# Patient Record
Sex: Female | Born: 1990 | Race: White | Hispanic: No | Marital: Single | Smoking: Former smoker
Health system: Southern US, Community
[De-identification: ages and names within clinical notes are randomized; demographics above are authoritative.]

## PROBLEM LIST (undated history)

## (undated) DIAGNOSIS — J45909 Unspecified asthma, uncomplicated: Secondary | ICD-10-CM

## (undated) DIAGNOSIS — D649 Anemia, unspecified: Secondary | ICD-10-CM

## (undated) DIAGNOSIS — J40 Bronchitis, not specified as acute or chronic: Secondary | ICD-10-CM

## (undated) DIAGNOSIS — F329 Major depressive disorder, single episode, unspecified: Secondary | ICD-10-CM

## (undated) DIAGNOSIS — F419 Anxiety disorder, unspecified: Secondary | ICD-10-CM

## (undated) DIAGNOSIS — F32A Depression, unspecified: Secondary | ICD-10-CM

## (undated) HISTORY — DX: Bronchitis, not specified as acute or chronic: J40

## (undated) HISTORY — PX: NO PAST SURGERIES: SHX2092

## (undated) HISTORY — DX: Major depressive disorder, single episode, unspecified: F32.9

## (undated) HISTORY — DX: Anxiety disorder, unspecified: F41.9

## (undated) HISTORY — PX: OTHER SURGICAL HISTORY: SHX169

## (undated) HISTORY — DX: Unspecified asthma, uncomplicated: J45.909

## (undated) HISTORY — DX: Depression, unspecified: F32.A

## (undated) SURGERY — Surgical Case
Anesthesia: Epidural

---

## 2015-02-26 ENCOUNTER — Observation Stay
Admission: EM | Admit: 2015-02-26 | Discharge: 2015-02-26 | Disposition: A | Discharge status: Home / Self Care | Payer: Medicaid Other | Attending: Obstetrics and Gynecology | Admitting: Obstetrics and Gynecology

## 2015-02-26 DIAGNOSIS — O99213 Obesity complicating pregnancy, third trimester: Principal | ICD-10-CM | POA: Insufficient documentation

## 2015-02-26 DIAGNOSIS — Z3A39 39 weeks gestation of pregnancy: Secondary | ICD-10-CM | POA: Diagnosis not present

## 2015-02-26 DIAGNOSIS — O468X3 Other antepartum hemorrhage, third trimester: Secondary | ICD-10-CM | POA: Insufficient documentation

## 2015-02-26 DIAGNOSIS — Z6841 Body Mass Index (BMI) 40.0 and over, adult: Secondary | ICD-10-CM | POA: Diagnosis not present

## 2015-02-26 DIAGNOSIS — N930 Postcoital and contact bleeding: Secondary | ICD-10-CM | POA: Diagnosis not present

## 2015-02-26 HISTORY — DX: Anemia, unspecified: D64.9

## 2015-02-26 NOTE — OB Triage Note (Signed)
Patient presents with c/o "vaginal bleeding" after intercourse tonight.  Active fetus reported, patient states "light pink discharge with wiping"  No active bleeding reported or noted on admission.  efm and toco applied. fhr-147 Abdomen soft.  Reports some "cramping" but no reglar contractions or pain.

## 2015-02-26 NOTE — Procedures (Cosign Needed)
24 yo G1 at [redacted]w[redacted]d who noted spotting today after intercourse, evaluated by nursing no active bleeding, soft abdomen, no contractions with reactive NST.  Routine labor precautions, follow up 10/10 with NST/AFI secondary to BMI >40.

## 2015-03-06 ENCOUNTER — Inpatient Hospital Stay
Admission: RE | Admit: 2015-03-06 | Discharge: 2015-03-10 | DRG: 765 | Disposition: A | Discharge status: Home / Self Care | Payer: Medicaid Other | Source: Ambulatory Visit | Attending: Obstetrics and Gynecology | Admitting: Obstetrics and Gynecology

## 2015-03-06 DIAGNOSIS — O339 Maternal care for disproportion, unspecified: Secondary | ICD-10-CM | POA: Diagnosis not present

## 2015-03-06 DIAGNOSIS — O99214 Obesity complicating childbirth: Secondary | ICD-10-CM | POA: Diagnosis present

## 2015-03-06 DIAGNOSIS — E669 Obesity, unspecified: Secondary | ICD-10-CM | POA: Diagnosis present

## 2015-03-06 DIAGNOSIS — O480 Post-term pregnancy: Principal | ICD-10-CM | POA: Diagnosis present

## 2015-03-06 DIAGNOSIS — Z3490 Encounter for supervision of normal pregnancy, unspecified, unspecified trimester: Secondary | ICD-10-CM

## 2015-03-06 DIAGNOSIS — Z3A40 40 weeks gestation of pregnancy: Secondary | ICD-10-CM | POA: Diagnosis not present

## 2015-03-06 DIAGNOSIS — O9081 Anemia of the puerperium: Secondary | ICD-10-CM | POA: Diagnosis not present

## 2015-03-06 DIAGNOSIS — Z6841 Body Mass Index (BMI) 40.0 and over, adult: Secondary | ICD-10-CM | POA: Diagnosis not present

## 2015-03-06 DIAGNOSIS — D62 Acute posthemorrhagic anemia: Secondary | ICD-10-CM | POA: Diagnosis not present

## 2015-03-06 LAB — TYPE AND SCREEN
ABO/RH(D): O NEG
Antibody Screen: NEGATIVE

## 2015-03-06 LAB — CBC
HCT: 36.9 % (ref 35.0–47.0)
HEMOGLOBIN: 12.8 g/dL (ref 12.0–16.0)
MCH: 31.2 pg (ref 26.0–34.0)
MCHC: 34.7 g/dL (ref 32.0–36.0)
MCV: 89.9 fL (ref 80.0–100.0)
PLATELETS: 259 10*3/uL (ref 150–440)
RBC: 4.11 MIL/uL (ref 3.80–5.20)
RDW: 13.3 % (ref 11.5–14.5)
WBC: 16.7 10*3/uL — AB (ref 3.6–11.0)

## 2015-03-06 MED ORDER — MISOPROSTOL 200 MCG PO TABS
800.0000 ug | ORAL_TABLET | ORAL | Status: DC
Start: 2015-03-06 — End: 2015-03-07
  Filled 2015-03-06: qty 4

## 2015-03-06 MED ORDER — LACTATED RINGERS IV SOLN
INTRAVENOUS | Status: DC
  Administered 2015-03-06: 125 mL/h via INTRAVENOUS
  Administered 2015-03-07: 13:00:00 via INTRAVENOUS
  Administered 2015-03-07: 125 mL/h via INTRAVENOUS

## 2015-03-06 MED ORDER — CITRIC ACID-SODIUM CITRATE 334-500 MG/5ML PO SOLN: 30.0000 mL | ORAL | Status: DC | PRN

## 2015-03-06 MED ORDER — OXYTOCIN 40 UNITS IN LACTATED RINGERS INFUSION - SIMPLE MED
1.0000 m[IU]/min | INTRAVENOUS | Status: DC
  Administered 2015-03-06: 1 m[IU]/min via INTRAVENOUS

## 2015-03-06 MED ORDER — LIDOCAINE HCL (PF) 1 % IJ SOLN
30.0000 mL | INTRAMUSCULAR | Status: DC | PRN
  Filled 2015-03-06: qty 30

## 2015-03-06 MED ORDER — TERBUTALINE SULFATE 1 MG/ML IJ SOLN: 0.2500 mg | Freq: Once | INTRAMUSCULAR | Status: DC | PRN

## 2015-03-06 MED ORDER — ONDANSETRON HCL 4 MG/2ML IJ SOLN: 4.0000 mg | Freq: Four times a day (QID) | INTRAMUSCULAR | Status: DC | PRN

## 2015-03-06 MED ORDER — LACTATED RINGERS IV SOLN: 500.0000 mL | INTRAVENOUS | Status: DC | PRN

## 2015-03-06 MED ORDER — ACETAMINOPHEN 325 MG PO TABS: 650.0000 mg | ORAL_TABLET | ORAL | Status: DC | PRN

## 2015-03-06 MED ORDER — OXYTOCIN BOLUS FROM INFUSION: 500.0000 mL | INTRAVENOUS | Status: DC

## 2015-03-06 MED ORDER — OXYTOCIN 40 UNITS IN LACTATED RINGERS INFUSION - SIMPLE MED
62.5000 mL/h | INTRAVENOUS | Status: DC
  Administered 2015-03-07: 1000 mL via INTRAVENOUS
  Filled 2015-03-06 (×2): qty 1000

## 2015-03-06 MED ORDER — AMMONIA AROMATIC IN INHA
0.3000 mL | Freq: Once | RESPIRATORY_TRACT | Status: DC | PRN
Start: 2015-03-06 — End: 2015-03-07
  Filled 2015-03-06: qty 10

## 2015-03-06 MED ORDER — INFLUENZA VAC SPLIT QUAD 0.5 ML IM SUSY
0.5000 mL | PREFILLED_SYRINGE | INTRAMUSCULAR | Status: DC
  Filled 2015-03-06: qty 0.5

## 2015-03-06 NOTE — H&P (Signed)
Date of Initial H&P: 02/27/15  History reviewed, patient examined, no change in status, except as noted below   24 year old G1 P0 with EDC=02/27/15  (LMP=19wk ultrasound) presents at 40.6 weeks for IOL due to postdates. Pregnancy c/b obesity (BMI43) and RH negative. Received Rhogam at 28 weeks (7/15). Has gained 62# with this pregnancy. EFW 01/30/15 was 3000gm (89th%). Current EFW by Le[place]= 4000-4200 gms.  Labs: o neg// RI//VI//TDAP UTD//GBS negative.  Meds: PNV and iron  NKDA  ROS: positive for irregular contractions. No VB or LOF. Baby active  EXAM: BP 127/74 mmHg  Pulse 96  Temp(Src) 98.5 F (36.9 C) (Oral)  Resp 16  Ht 5\' 2"  (1.575 m)  Wt 248 lb (112.492 kg)  BMI 45.35 kg/m2  LMP 05/23/2014 (LMP Unknown)  General: in NAD FHR 150 with accels to 170, moderate variability, no decelerations Toco: contractions q3-11 min apart US: cephalic/LOP Cervix: 4/80%/-1 to -2  A: IUP at 40.6 weeks for IOL due to postdates with a Bishop score of 9 Suspect macrosomic infant Cat 1tracing   P: Pitocin induction. Reviewed risks of induction including hyperstimulation, FITL, FTP, failed induction and Cesarean section. Patient agrees with POM GBS neg- no PPX needed At risk for shoulder dystocia  Corrina Steffensen, CNM

## 2015-03-06 NOTE — Progress Notes (Signed)
Bedside US done by C.Sharen HonesGutierrez, CNM.

## 2015-03-07 ENCOUNTER — Inpatient Hospital Stay: Payer: Medicaid Other | Admitting: Certified Registered"

## 2015-03-07 ENCOUNTER — Encounter
Admission: RE | Disposition: A | Discharge status: Home / Self Care | Payer: Self-pay | Source: Ambulatory Visit | Attending: Obstetrics and Gynecology

## 2015-03-07 ENCOUNTER — Encounter: Payer: Self-pay | Admitting: *Deleted

## 2015-03-07 LAB — ABO/RH: ABO/RH(D): O NEG

## 2015-03-07 SURGERY — Surgical Case
Anesthesia: Epidural

## 2015-03-07 MED ORDER — ONDANSETRON HCL 4 MG/2ML IJ SOLN
INTRAMUSCULAR | Status: DC | PRN
  Administered 2015-03-07: 4 mg via INTRAVENOUS

## 2015-03-07 MED ORDER — BUPIVACAINE HCL (PF) 0.5 % IJ SOLN
INTRAMUSCULAR | Status: AC
  Filled 2015-03-07: qty 30

## 2015-03-07 MED ORDER — LIDOCAINE HCL (PF) 2 % IJ SOLN
INTRAMUSCULAR | Status: DC | PRN
  Administered 2015-03-07 (×2): 100 mg via INTRADERMAL

## 2015-03-07 MED ORDER — PRENATAL MULTIVITAMIN CH
1.0000 | ORAL_TABLET | Freq: Every day | ORAL | Status: DC
  Administered 2015-03-08 – 2015-03-10 (×3): 1 via ORAL
  Filled 2015-03-07 (×3): qty 1

## 2015-03-07 MED ORDER — DIBUCAINE 1 % RE OINT: 1.0000 "application " | TOPICAL_OINTMENT | RECTAL | Status: DC | PRN

## 2015-03-07 MED ORDER — OXYTOCIN 40 UNITS IN LACTATED RINGERS INFUSION - SIMPLE MED
62.5000 mL/h | INTRAVENOUS | Status: AC
  Administered 2015-03-08: 62.5 mL/h via INTRAVENOUS
  Filled 2015-03-07: qty 1000

## 2015-03-07 MED ORDER — CITRIC ACID-SODIUM CITRATE 334-500 MG/5ML PO SOLN
30.0000 mL | ORAL | Status: AC
  Administered 2015-03-07: 30 mL via ORAL
  Filled 2015-03-07: qty 15

## 2015-03-07 MED ORDER — SENNOSIDES-DOCUSATE SODIUM 8.6-50 MG PO TABS
2.0000 | ORAL_TABLET | ORAL | Status: DC
  Administered 2015-03-07 – 2015-03-09 (×3): 2 via ORAL
  Filled 2015-03-07 (×3): qty 2

## 2015-03-07 MED ORDER — LANOLIN HYDROUS EX OINT
1.0000 | TOPICAL_OINTMENT | CUTANEOUS | Status: DC | PRN
Start: 2015-03-07 — End: 2015-03-10

## 2015-03-07 MED ORDER — BUPIVACAINE HCL (PF) 0.25 % IJ SOLN
INTRAMUSCULAR | Status: DC | PRN
  Administered 2015-03-07: 5 mL via EPIDURAL

## 2015-03-07 MED ORDER — BUPIVACAINE 0.25 % ON-Q PUMP DUAL CATH 400 ML: 400.0000 mL | INJECTION | Status: DC

## 2015-03-07 MED ORDER — SIMETHICONE 80 MG PO CHEW
80.0000 mg | CHEWABLE_TABLET | ORAL | Status: DC
  Administered 2015-03-07: 80 mg via ORAL
  Filled 2015-03-07: qty 1

## 2015-03-07 MED ORDER — OXYCODONE-ACETAMINOPHEN 5-325 MG PO TABS
2.0000 | ORAL_TABLET | ORAL | Status: DC | PRN
  Administered 2015-03-07 – 2015-03-10 (×10): 2 via ORAL
  Filled 2015-03-07 (×11): qty 2

## 2015-03-07 MED ORDER — SIMETHICONE 80 MG PO CHEW
80.0000 mg | CHEWABLE_TABLET | Freq: Three times a day (TID) | ORAL | Status: DC
  Administered 2015-03-08 – 2015-03-10 (×4): 80 mg via ORAL
  Filled 2015-03-07 (×5): qty 1

## 2015-03-07 MED ORDER — OXYTOCIN 10 UNIT/ML IJ SOLN
INTRAMUSCULAR | Status: AC
  Filled 2015-03-07: qty 2

## 2015-03-07 MED ORDER — MORPHINE SULFATE (PF) 0.5 MG/ML IJ SOLN
INTRAMUSCULAR | Status: DC | PRN
  Administered 2015-03-07: 2 mg via EPIDURAL

## 2015-03-07 MED ORDER — BUPIVACAINE HCL 0.5 % IJ SOLN
INTRAMUSCULAR | Status: DC | PRN
  Administered 2015-03-07: 10 mL

## 2015-03-07 MED ORDER — BUPIVACAINE HCL (PF) 0.5 % IJ SOLN: 10.0000 mL | Freq: Once | INTRAMUSCULAR | Status: DC

## 2015-03-07 MED ORDER — FENTANYL 2.5 MCG/ML W/ROPIVACAINE 0.2% IN NS 100 ML EPIDURAL INFUSION (ARMC-ANES)
EPIDURAL | Status: AC
  Administered 2015-03-07: 9 mL/h via EPIDURAL
  Filled 2015-03-07: qty 100

## 2015-03-07 MED ORDER — BUPIVACAINE 0.25 % ON-Q PUMP DUAL CATH 400 ML
INJECTION | Status: AC
  Filled 2015-03-07: qty 400

## 2015-03-07 MED ORDER — DIPHENHYDRAMINE HCL 25 MG PO CAPS: 25.0000 mg | ORAL_CAPSULE | Freq: Four times a day (QID) | ORAL | Status: DC | PRN

## 2015-03-07 MED ORDER — LACTATED RINGERS IV SOLN: INTRAVENOUS | Status: DC

## 2015-03-07 MED ORDER — ACETAMINOPHEN 325 MG PO TABS: 650.0000 mg | ORAL_TABLET | ORAL | Status: DC | PRN

## 2015-03-07 MED ORDER — SIMETHICONE 80 MG PO CHEW
80.0000 mg | CHEWABLE_TABLET | ORAL | Status: DC | PRN
  Administered 2015-03-09: 80 mg via ORAL

## 2015-03-07 MED ORDER — WITCH HAZEL-GLYCERIN EX PADS: 1.0000 "application " | MEDICATED_PAD | CUTANEOUS | Status: DC | PRN

## 2015-03-07 MED ORDER — MENTHOL 3 MG MT LOZG: 1.0000 | LOZENGE | OROMUCOSAL | Status: DC | PRN

## 2015-03-07 MED ORDER — OXYCODONE-ACETAMINOPHEN 5-325 MG PO TABS
1.0000 | ORAL_TABLET | ORAL | Status: DC | PRN
  Administered 2015-03-10 (×2): 1 via ORAL
  Filled 2015-03-07: qty 1

## 2015-03-07 MED ORDER — LIDOCAINE-EPINEPHRINE (PF) 1.5 %-1:200000 IJ SOLN
INTRAMUSCULAR | Status: DC | PRN
  Administered 2015-03-07: 3 mL via EPIDURAL

## 2015-03-07 MED ORDER — IBUPROFEN 600 MG PO TABS
600.0000 mg | ORAL_TABLET | Freq: Four times a day (QID) | ORAL | Status: DC
  Administered 2015-03-08 – 2015-03-10 (×9): 600 mg via ORAL
  Filled 2015-03-07 (×9): qty 1

## 2015-03-07 MED ORDER — CEFAZOLIN SODIUM-DEXTROSE 2-3 GM-% IV SOLR
2.0000 g | INTRAVENOUS | Status: AC
  Administered 2015-03-07 (×2): 2 g via INTRAVENOUS
  Filled 2015-03-07: qty 50

## 2015-03-07 SURGICAL SUPPLY — 29 items
BAG COUNTER SPONGE EZ (MISCELLANEOUS) ×2 IMPLANT
CANISTER SUCT 3000ML (MISCELLANEOUS) ×3 IMPLANT
CATH KIT ON-Q SILVERSOAK 5IN (CATHETERS) ×6 IMPLANT
CHLORAPREP W/TINT 26ML (MISCELLANEOUS) ×6 IMPLANT
CLOSURE WOUND 1/2 X4 (GAUZE/BANDAGES/DRESSINGS) ×1
COUNTER SPONGE BAG EZ (MISCELLANEOUS) ×1
DRSG TELFA 3X8 NADH (GAUZE/BANDAGES/DRESSINGS) ×3 IMPLANT
ELECT CAUTERY BLADE 6.4 (BLADE) ×3 IMPLANT
GAUZE SPONGE 4X4 12PLY STRL (GAUZE/BANDAGES/DRESSINGS) ×3 IMPLANT
GLOVE BIO SURGEON STRL SZ7 (GLOVE) ×12 IMPLANT
GLOVE INDICATOR 7.5 STRL GRN (GLOVE) ×12 IMPLANT
GOWN STRL REUS W/ TWL LRG LVL3 (GOWN DISPOSABLE) ×4 IMPLANT
GOWN STRL REUS W/TWL LRG LVL3 (GOWN DISPOSABLE) ×8
KIT PREVENA INCISION MGT20CM45 (CANNISTER) ×3 IMPLANT
LIQUID BAND (GAUZE/BANDAGES/DRESSINGS) ×3 IMPLANT
NS IRRIG 1000ML POUR BTL (IV SOLUTION) ×3 IMPLANT
PACK C SECTION AR (MISCELLANEOUS) ×3 IMPLANT
PAD GROUND ADULT SPLIT (MISCELLANEOUS) ×3 IMPLANT
PAD OB MATERNITY 4.3X12.25 (PERSONAL CARE ITEMS) ×3 IMPLANT
PAD PREP 24X41 OB/GYN DISP (PERSONAL CARE ITEMS) ×3 IMPLANT
RTRCTR C-SECT PINK 34CM XLRG (MISCELLANEOUS) ×3 IMPLANT
STAPLER INSORB 30 2030 C-SECTI (MISCELLANEOUS) ×3 IMPLANT
STRIP CLOSURE SKIN 1/2X4 (GAUZE/BANDAGES/DRESSINGS) ×2 IMPLANT
SUT MNCRL AB 4-0 PS2 18 (SUTURE) ×3 IMPLANT
SUT PDS AB 1 TP1 96 (SUTURE) ×6 IMPLANT
SUT PLAIN 2 0 XLH (SUTURE) ×3 IMPLANT
SUT VIC AB 0 CTX 36 (SUTURE) ×4
SUT VIC AB 0 CTX36XBRD ANBCTRL (SUTURE) ×2 IMPLANT
SUT VIC AB 2-0 CT1 36 (SUTURE) ×3 IMPLANT

## 2015-03-07 NOTE — Discharge Summary (Signed)
Obstetric Discharge Summary Reason for Admission: induction of labor and post dates Prenatal Procedures: NST Intrapartum Procedures: cesarean: low cervical, transverse Postpartum Procedures: none Complications-Operative and Postpartum: none HEMOGLOBIN  Date Value Ref Range Status  03/08/2015 8.8* 12.0 - 16.0 g/dL Final    Comment:    RESULT REPEATED AND VERIFIED   HCT  Date Value Ref Range Status  03/08/2015 25.9* 35.0 - 47.0 % Final    Physical Exam:  General: alert and appears stated age  CV RRR Pulm: ctabl, nl respiratory effort Lochia: appropriate Uterine Fundus: firm Incision: wound vac in place, on-q pump in place DVT Evaluation: No evidence of DVT seen on physical exam.  Discharge Diagnoses: Term Pregnancy-delivered  Discharge Information: Date: 03/10/2015 Activity: Pelvic rest, no heavy living greater than 10lbs for 6 weeks, no driving for 2 weeks Diet: routine   Medication List    TAKE these medications        acetaminophen 325 MG tablet  Commonly known as:  TYLENOL  Take 2 tablets (650 mg total) by mouth every 4 (four) hours as needed (for pain scale < 4).     ferrous sulfate 325 (65 FE) MG EC tablet  Take 1 tablet (325 mg total) by mouth 2 (two) times daily.     FUSION PLUS Caps  Take 1 capsule by mouth.     ibuprofen 600 MG tablet  Commonly known as:  ADVIL,MOTRIN  Take 1 tablet (600 mg total) by mouth every 6 (six) hours.     oxyCODONE-acetaminophen 5-325 MG tablet  Commonly known as:  PERCOCET/ROXICET  Take 1 tablet by mouth every 4 (four) hours as needed for moderate pain.     prenatal multivitamin Tabs tablet  Take 1 tablet by mouth daily at 12 noon.        Condition: stable Discharge to: home Follow-up Information    Follow up with Lorrene ReidSTAEBLER, ANDREAS M, MD In 1 week.   Specialty:  Obstetrics and Gynecology   Why:  For wound re-check   Contact information:   [address] [place] [address] [phone]       Follow up  with Lorrene ReidSTAEBLER, ANDREAS M, MD In 6 weeks.   Specialty:  Obstetrics and Gynecology   Why:  for routine post partum check   Contact information:   [address] [place] [address] [phone]       Newborn Data: Live born female  Birth Weight: 10 lb 2 oz (4593 g) APGAR: 8, 9  Home with mother.  Ward, Chelsea C 03/10/2015, 12:04 PM

## 2015-03-07 NOTE — Transfer of Care (Signed)
Immediate Anesthesia Transfer of Care Note  Patient: Donna Caldwell  Procedure(s) Performed: Procedure(s): CESAREAN SECTION (N/A)  Patient Location: PACU  Anesthesia Type:Epidural  Level of Consciousness: awake, alert  and oriented  Airway & Oxygen Therapy: Patient Spontanous Breathing  Post-op Assessment: Report given to RN and Post -op Vital signs reviewed and stable  Post vital signs: Reviewed and stable  Last Vitals:  Filed Vitals:   03/07/15 1511  BP: 126/75  Pulse: 106  Temp: 37.2 C  Resp: 18    Complications: No apparent anesthesia complications

## 2015-03-07 NOTE — Op Note (Signed)
Preoperative Diagnosis: 1) 24 y.o. 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 G1P1000 at 5274w1d 2) Active phase secondary to CPD  Operation Performed: Primary low transverse C-section via pfannenstiel skin incision  Indication: Failure to progress, arrest at 9.5cm  Anesthesia: Epidural   Primary Surgeon: Vena AustriaAndreas Alban Marucci, MD  Preoperative Antibiotics: 2g ancef  Estimated Blood Loss: 700mL  IV Fluids:85900mL  Urine Output:: 150mL  Drains or Tubes: Foley to gravity drainage, ON-Q catheter system, prevena incisional wound vac  Implants: none  Specimens Removed: none  Complications: none  Intraoperative Findings:  Normal tubes ovaries and uterus.  Delivery resulted in the birth of a liveborn female  APGAR (1 MIN): 8   APGAR (5 MINS): 9   Weight 10lbs 2oz  Patient Condition: stable  Procedure in Detail:  Patient was taken to the operating room were she was administered regional anesthesia.  She was positioned in the supine position, prepped and draped in the  Usual sterile fashion.  Prior to proceeding with the case a time out was performed and the level of anesthetic was checked and noted to be adequate.  Utilizing the scalpel a pfannenstiel skin incision was made 2cm above the pubic symphysis and carried down sharply to the the level of the rectus fascia.  The fascia was incised in the midline using the scalpel and then extended using mayo scissors.  The superior border of the rectus fascia was grasped with two Kocher clamps and the underlying rectus muscles were dissected of the fascia using blunt dissection.  The median raphae was incised using Mayo scissors.   The inferior border of the rectus fascia was dissected of the rectus muscles in a similar fashion.  The midline was identified, the peritoneum was entered bluntly and expanded using manual tractions.  The uterus was noted to be in a none rotated position.  A large Alexis  retractor was placed to allow visualiztion.  A bladder flap was not created.  A low transverse incision was scored on the lower uterine segment.  The hysterotomy was entered bluntly using the operators finger.  The hysterotomy incision was extended using manual traction.  The operators hand was placed within the hysterotomy position noting the fetus to be within the OA position.  The vertex was grasped, flexed, brought to the incision, and delivered a traumatically using fundal pressure.  The remainder of the body delivered with ease.  The infant was suctioned, cord was clamped and cut before handing off to the awaiting neonatologist.  The placenta was delivered using manual extraction.  The uterus was exteriorized, wiped clean of clots and debris using two moist laps.  The hysterotomy was closed using a two layer closure of 0 Vicryl, with the first being a running locked, the second a vertical imbricating.  The uterus was returned to the abdomen.  The peritoneal gutters were wiped clean of clots and debris using two moist laps.  The hysterotomy incision was re-inspected noted to be hemostatic.  The rectus muscles were re-approximated in the midline using a single 2-0 Vicryl mattress stitch.  The rectus muscles were inspected noted to be hemostatic.  The superior border of the rectus fascia was grasped with a Kocher clamp.  The ON-Q trocars were then placed 4cm above the superior border of the incision and tunneled subfascially.  The introducers were removed and the catheters were threaded through the sleeves after which the sleeves were removed.  The fascia was closed using a  looped #1 PDS in a running fashion taking 1cm by 1cm bites.  The subcutaneous tissue was irrigated using warm saline, hemostasis achieved using the bovis.  The subcutaneous dead space was greater than 3cm and was closed.  The subcutaneous dead space was obliterated by using a 53-T 0 Chromic in a running fashion.  The skin was closed using  insorb staples.  A provena wound vac was then applied.  Sponge needle and instrument counts were corrects times two.  The patient tolerated the procedure well and was taken to the recovery room in stable condition.

## 2015-03-07 NOTE — Progress Notes (Signed)
Pt was assisted to bathroom and back.  States she is starting to feel cramp like pain in lower abd and back.  Toco adjusted.

## 2015-03-07 NOTE — Progress Notes (Signed)
Pericare given

## 2015-03-07 NOTE — Progress Notes (Signed)
UC palpates soft.  Not graphing to maternal position.

## 2015-03-07 NOTE — Progress Notes (Signed)
Pt. To OR for primary Cesarean section. S.O. At the bedside, pt.'s mother at the bedside. SO to OR with patient.

## 2015-03-07 NOTE — Progress Notes (Signed)
Dr. Bonney AidStaebler at the bedside to discuss C-section procedure. Consent signed; questions answered.

## 2015-03-07 NOTE — Progress Notes (Signed)
Primary C-Section, live baby girl, 10lbs 2oz.. Pt. Alert. See delivery note.

## 2015-03-07 NOTE — Progress Notes (Signed)
Subjective:  Comfortable with epidural in place   Objective:   Vitals: Blood pressure 117/64, pulse 99, temperature 98.9 F (37.2 C), temperature source Oral, resp. rate 18, height 5\' 2"  (1.575 m), weight 112.492 kg (248 lb), last menstrual period 05/23/2014, SpO2 99 %. General:  NAD Abdomen: Gravid, non-tender Cervical Exam:  Dilation: Lip/rim Effacement (%): 100 Cervical Position: Anterior Station: 0 Presentation: Vertex Exam by:: Millner, RN  FHT: 160, moderate, no accels, no decels Toco: q13min  Results for orders placed or performed during the hospital encounter of 03/06/15 (from the past 24 hour(s))  CBC     Status: Abnormal   Collection Time: 03/06/15  8:18 PM  Result Value Ref Range   WBC 16.7 (H) 3.6 - 11.0 K/uL   RBC 4.11 3.80 - 5.20 MIL/uL   Hemoglobin 12.8 12.0 - 16.0 g/dL   HCT 56.236.9 13.035.0 - 86.547.0 %   MCV 89.9 80.0 - 100.0 fL   MCH 31.2 26.0 - 34.0 pg   MCHC 34.7 32.0 - 36.0 g/dL   RDW 78.413.3 69.611.5 - 29.514.5 %   Platelets 259 150 - 440 K/uL  Type and screen [facility]     Status: None   Collection Time: 03/06/15  8:18 PM  Result Value Ref Range   ABO/RH(D) O NEG    Antibody Screen NEG    Sample Expiration 03/09/2015   ABO/Rh     Status: None   Collection Time: 03/06/15  8:19 PM  Result Value Ref Range   ABO/RH(D) O NEG     Assessment:   24 y.o. G1P0 7088w1d postdates IOL, prolonged active phase  Plan:   1) Labor - continues with anterior lip over 2-hrs, discussed findings concern for CPD.  Growth scan on 01/30/2015 was 6lbs 10oz or 3000g consistent with 61%ile, given this would expect fetus to be around 8lbs 10oz although margin of error on scan 1.5lbs.  After discussion the the fetal heart rate tracing, meconium, and continued anterior lip patient opts to trial of increasing pitocin over the course of the next hour and rechecking cervix.  If unchanged in the next hour or if evidence of fetal intolerance to labor she is amenable to proceeding  with primary low transverse C-sections  2) Fetus - category II tracing.

## 2015-03-07 NOTE — Anesthesia Procedure Notes (Addendum)
Epidural Patient location during procedure: OB Start time: 03/07/2015 9:35 AM End time: 03/07/2015 10:00 AM  Staffing Anesthesiologist: Yevette EdwardsADAMS, JAMES G Resident/CRNA: Mathews ArgyleLOGAN, BENJAMIN Performed by: resident/CRNA   Preanesthetic Checklist Completed: patient identified, site marked, surgical consent, pre-op evaluation, timeout performed, IV checked, risks and benefits discussed and monitors and equipment checked  Epidural Patient position: sitting Prep: Betadine and site prepped and draped Patient monitoring: heart rate, continuous pulse ox and blood pressure Approach: midline Location: L4-L5 Injection technique: LOR saline  Needle:  Needle type: Tuohy  Needle gauge: 18 G Needle length: 9 cm Needle insertion depth: 8 cm Catheter type: closed end flexible Catheter size: 20 Guage Catheter at skin depth: 13 cm Test dose: negative and 1.5% lidocaine with Epi 1:200 K  Assessment Events: blood not aspirated, injection not painful, no injection resistance, negative IV test and no paresthesia  Additional Notes   Patient tolerated the insertion well without complications.Reason for block:procedure for pain  Epidural   Additional Notes Epidural catheter removed intact. Site clean and dry without redness or drainage.

## 2015-03-07 NOTE — Anesthesia Preprocedure Evaluation (Signed)
Anesthesia Evaluation  Patient identified by MRN, date of birth, ID band Patient awake    Reviewed: Allergy & Precautions, H&P , NPO status , Patient's Chart, lab work & pertinent test results, reviewed documented beta blocker date and time   Airway Mallampati: III  TM Distance: >3 FB Neck ROM: full    Dental no notable dental hx. (+) Teeth Intact   Pulmonary neg pulmonary ROS, former smoker,    Pulmonary exam normal breath sounds clear to auscultation       Cardiovascular Exercise Tolerance: Good negative cardio ROS Normal cardiovascular exam Rhythm:regular Rate:Normal     Neuro/Psych negative neurological ROS  negative psych ROS   GI/Hepatic negative GI ROS, Neg liver ROS,   Endo/Other  negative endocrine ROS  Renal/GU negative Renal ROS  negative genitourinary   Musculoskeletal   Abdominal   Peds  Hematology negative hematology ROS (+) anemia ,   Anesthesia Other Findings   Reproductive/Obstetrics (+) Pregnancy                             Anesthesia Physical Anesthesia Plan  ASA: II  Anesthesia Plan: Regional and Epidural   Post-op Pain Management:    Induction:   Airway Management Planned:   Additional Equipment:   Intra-op Plan:   Post-operative Plan:   Informed Consent: I have reviewed the patients History and Physical, chart, labs and discussed the procedure including the risks, benefits and alternatives for the proposed anesthesia with the patient or authorized representative who has indicated his/her understanding and acceptance.     Plan Discussed with: CRNA  Anesthesia Plan Comments:         Anesthesia Quick Evaluation

## 2015-03-07 NOTE — Progress Notes (Signed)
S: Has not slept much during the night due to contractions. Difficulty keeping up with baby and contractions with external monitor.  O: Filed Vitals:   03/07/15 0600 03/07/15 0630 03/07/15 0700 03/07/15 0730  BP: 122/75 109/67 115/73 124/68  Pulse: 107 109 107 108  Temp: 98.5 F (36.9 C)     TempSrc: Oral     Resp:      Height:      Weight:         Gen: looks uncomfortable, AAOx3       FHT:140s,  mod var + accelerations to 160s,  no decelerations TOCO: Q 2 min on 10 miu/min Pitocin SVE: 5/C/-1   A/P:  24 y.o. yo G1P0 at 5648w1d admitted for IOL due to postdates   AROM-moderate MSAF. IUPC inserted and Pitocin decreased to 5 miu/min  FWB: Reassuring Cat 1 tracing. FSE applied, but removed shortly thereafter-not functioning properly.  Titrate pitocin to get 200 mvu  Loveta Dellis, CNM     Dadrian Ballantine 7:45 AM

## 2015-03-08 LAB — CBC
HCT: 25.9 % — ABNORMAL LOW (ref 35.0–47.0)
Hemoglobin: 8.8 g/dL — ABNORMAL LOW (ref 12.0–16.0)
MCH: 30.7 pg (ref 26.0–34.0)
MCHC: 34.1 g/dL (ref 32.0–36.0)
MCV: 90.1 fL (ref 80.0–100.0)
PLATELETS: 202 10*3/uL (ref 150–440)
RBC: 2.87 MIL/uL — ABNORMAL LOW (ref 3.80–5.20)
RDW: 13.3 % (ref 11.5–14.5)
WBC: 18.5 10*3/uL — ABNORMAL HIGH (ref 3.6–11.0)

## 2015-03-08 LAB — RPR: RPR: NONREACTIVE

## 2015-03-08 LAB — FETAL SCREEN: FETAL SCREEN: NEGATIVE

## 2015-03-08 MED ORDER — RHO D IMMUNE GLOBULIN 1500 UNIT/2ML IJ SOSY
300.0000 ug | PREFILLED_SYRINGE | Freq: Once | INTRAMUSCULAR | Status: AC
  Administered 2015-03-08: 300 ug via INTRAVENOUS
  Filled 2015-03-08: qty 2

## 2015-03-08 MED ORDER — INFLUENZA VAC SPLIT QUAD 0.5 ML IM SUSY
0.5000 mL | PREFILLED_SYRINGE | INTRAMUSCULAR | Status: AC | PRN
  Administered 2015-03-10: 0.5 mL via INTRAMUSCULAR
  Filled 2015-03-08: qty 0.5

## 2015-03-08 NOTE — Anesthesia Postprocedure Evaluation (Signed)
  Anesthesia Post-op Note  Patient: Donna Caldwell  Procedure(s) Performed: Procedure(s): CESAREAN SECTION (N/A)  Anesthesia type:No value filed.  Patient location: 351   Post pain: Pain level controlled  Post assessment: Post-op Vital signs reviewed, Patient's Cardiovascular Status Stable, Respiratory Function Stable, Patent Airway and No signs of Nausea or vomiting  Post vital signs: Reviewed and stable  Last Vitals:  Filed Vitals:   03/08/15 0702  BP: 94/57  Pulse: 97  Temp: 36.6 C  Resp: 18    Level of consciousness: awake, alert  and patient cooperative  Complications: No apparent anesthesia complications

## 2015-03-08 NOTE — Progress Notes (Signed)
Patient ID: Donna Caldwell, female   DOB: 03/17/1991, 24 y.o.   MRN: [id number] Obstetric Postpartum/PostOperative Daily Progress Note Subjective:  24 y.o. G1P1000 status post primary cesarean delivery.  She is ambulating (just to the sink so far.  No dizzyness, lightheadedness, shortness of breath), is tolerating po, is not voiding spontaneously (with cathter still in place).  Her pain is well controlled on PO pain medications. Her lochia is slightly heavier than menses.   Medications SCHEDULED MEDICATIONS  . ibuprofen  600 mg Oral 4 times per day  . prenatal multivitamin  1 tablet Oral Q1200  . senna-docusate  2 tablet Oral Q24H  . simethicone  80 mg Oral TID PC  . simethicone  80 mg Oral Q24H    MEDICATION INFUSIONS  . lactated ringers    . oxytocin 40 units in LR 1000 mL 62.5 mL/hr (03/08/15 0424)    PRN MEDICATIONS  acetaminophen, witch hazel-glycerin **AND** dibucaine, diphenhydrAMINE, lanolin, menthol-cetylpyridinium, oxyCODONE-acetaminophen, oxyCODONE-acetaminophen, simethicone    Objective:   Filed Vitals:   03/08/15 0350 03/08/15 0420 03/08/15 0539 03/08/15 0702  BP:  90/37  94/57  Pulse:  98  97  Temp:  98.1 F (36.7 C)  97.9 F (36.6 C)  TempSrc:  Oral  Oral  Resp: 18 18 18 18   Height:      Weight:      SpO2: 96% 99% 98% 97%    Current Vital Signs 24h Vital Sign Ranges  T 97.9 F (36.6 C) Temp  Avg: 98.5 F (36.9 C)  Min: 97.8 F (36.6 C)  Max: 99.3 F (37.4 C)  BP (!) 94/57 mmHg (nurse Laurie notified) BP  Min: 90/37  Max: 127/69  HR 97 Pulse  Avg: 103.8  Min: 97  Max: 116  RR 18 Resp  Avg: 18.7  Min: 18  Max: 20  SaO2 97 % Not Delivered SpO2  Avg: 97.1 %  Min: 95 %  Max: 99 %       24 Hour I/O Current Shift I/O  Time Ins Outs 10/18 0701 - 10/19 0700 In: 2977 [P.O.:840; I.V.:1787] Out: 2275 [Urine:1475]    General: NAD Pulmonary: no increased work of breathing Abdomen: non-distended, non-tender, fundus firm at level of umbilicus Inc:  Clean/dry/intact Extremities: no edema, no erythema, no tenderness  Labs:   Recent Labs Lab 03/06/15 2018 03/08/15 0550  WBC 16.7* 18.5*  HGB 12.8 8.8*  HCT 36.9 25.9*  PLT 259 202     Assessment:   24 y.o. G1P1001 postoperative day # 1, s/p primary cesarean section  Plan:  1) Acute blood loss anemia - hemodynamically stable and asymptomatic - po ferrous sulfate  2) O NEG / Rubella  Immune / Varicella Immune  3) TDAP status up to date, order flu vaccine for prior to discharge  4) breast /Contraception = TBD  5) Disposition, home postop day 2 or 3  Conard NovakStephen D. Carlis Burnsworth, MD 03/08/2015 11:02 AM

## 2015-03-09 ENCOUNTER — Encounter: Payer: Self-pay | Admitting: Obstetrics and Gynecology

## 2015-03-09 LAB — RHOGAM INJECTION: UNIT DIVISION: 0

## 2015-03-09 NOTE — Progress Notes (Signed)
Patient ID: Donna Caldwell, female   DOB: 05/02/1991, 24 y.o.   MRN: [id number] Obstetric Postpartum/PostOperative Daily Progress Note Subjective:  24 y.o. G1P1000 status post primary cesarean delivery day 2.  She is ambulating, is tolerating po, is  voiding spontaneously.  Her pain is well controlled on PO pain medications. Her lochia is moderate.   Medications SCHEDULED MEDICATIONS  . ibuprofen  600 mg Oral 4 times per day  . prenatal multivitamin  1 tablet Oral Q1200  . senna-docusate  2 tablet Oral Q24H  . simethicone  80 mg Oral TID PC  . simethicone  80 mg Oral Q24H    MEDICATION INFUSIONS  . lactated ringers      PRN MEDICATIONS  acetaminophen, witch hazel-glycerin **AND** dibucaine, diphenhydrAMINE, Influenza vac split quadrivalent PF, lanolin, menthol-cetylpyridinium, oxyCODONE-acetaminophen, oxyCODONE-acetaminophen, simethicone    Objective:   Filed Vitals:   03/08/15 2000 03/08/15 2351 03/09/15 0324 03/09/15 0723  BP: 91/54   103/54  Pulse: 83   92  Temp: 97.8 F (36.6 C) 98.1 F (36.7 C) 98.6 F (37 C) 98 F (36.7 C)  TempSrc: Oral Oral Oral Oral  Resp: 18   18  Height:      Weight:      SpO2:    99%    Current Vital Signs 24h Vital Sign Ranges  T 98 F (36.7 C) Temp  Avg: 98.1 F (36.7 C)  Min: 97.8 F (36.6 C)  Max: 98.6 F (37 C)  BP (!) 103/54 mmHg BP  Min: 91/54  Max: 108/56  HR 92 Pulse  Avg: 89  Min: 83  Max: 92  RR 18 Resp  Avg: 18  Min: 18  Max: 18  SaO2 99 % Not Delivered SpO2  Avg: 99 %  Min: 99 %  Max: 99 %       24 Hour I/O Current Shift I/O  Time Ins Outs 10/19 0701 - 10/20 0700 In: -  Out: 1900 [Urine:1900]    General: NAD Pulmonary: no increased work of breathing Abdomen: non-distended, non-tender, fundus firm at level of umbilicus Inc: Clean/dry/intact, overlying suction dressing in place Extremities: no edema, no erythema, no tenderness  Labs:   Recent Labs Lab 03/06/15 2018 03/08/15 0550  WBC 16.7* 18.5*  HGB 12.8 8.8*   HCT 36.9 25.9*  PLT 259 202     Assessment:   24 y.o. G1P1001 postoperative day # 2, s/p primary cesarean section  Plan:  1) Acute blood loss anemia - hemodynamically stable and asymptomatic - po ferrous sulfate  2) O NEG / Rubella  Immune / Varicella Immune  3) TDAP status up to date, order flu vaccine for prior to discharge  4) breast /Contraception = TBD  5) Disposition, home postop day 3  Annamarie MajorPaul Daritza Brees, MD 03/09/2015 10:02 AM

## 2015-03-10 MED ORDER — IBUPROFEN 600 MG PO TABS: 600.0000 mg | ORAL_TABLET | Freq: Four times a day (QID) | ORAL | Status: DC

## 2015-03-10 MED ORDER — FERROUS SULFATE 325 (65 FE) MG PO TBEC: 325.0000 mg | DELAYED_RELEASE_TABLET | Freq: Two times a day (BID) | ORAL | Status: DC

## 2015-03-10 MED ORDER — ACETAMINOPHEN 325 MG PO TABS: 650.0000 mg | ORAL_TABLET | ORAL | Status: DC | PRN

## 2015-03-10 MED ORDER — OXYCODONE-ACETAMINOPHEN 5-325 MG PO TABS: 1.0000 | ORAL_TABLET | ORAL | Status: DC | PRN

## 2015-03-10 NOTE — Progress Notes (Signed)
Patient discharged home with infant. Discharge instructions, prescriptions and follow up appointment given to and reviewed with patient. Patient verbalized understanding. Escorted out with infant by auxillary. 

## 2015-03-10 NOTE — Discharge Instructions (Signed)
Discharge instructions:   Call office if you have any of the following: headache, visual changes, fever >100.4 F, chills, breast concerns, excessive vaginal bleeding, incision drainage or problems, leg pain or redness, depression or any other concerns.   Activity: Do not lift > 10 lbs for 6 weeks.  No intercourse or tampons for 6 weeks.  No driving for 1-2 weeks.  Continue prenatal vitamin and iron.  Increase calories and fluids while breastfeeding.

## 2016-09-04 ENCOUNTER — Ambulatory Visit: Payer: Medicaid Other | Attending: Family Medicine

## 2016-09-04 DIAGNOSIS — R262 Difficulty in walking, not elsewhere classified: Secondary | ICD-10-CM | POA: Diagnosis present

## 2016-09-04 DIAGNOSIS — M25572 Pain in left ankle and joints of left foot: Secondary | ICD-10-CM | POA: Diagnosis present

## 2016-09-04 DIAGNOSIS — M25571 Pain in right ankle and joints of right foot: Secondary | ICD-10-CM | POA: Diagnosis present

## 2016-09-04 NOTE — Patient Instructions (Signed)
  Sitting on a chair:    Roll the soccer ball back with your left foot to feel a stretch behind your leg.    Hold for 5 seconds   Repeat 10 times   Perform 3 sets daily     Perform the following band resisted exercises in about 2-3 weeks so long as your ankle/leg is comfortable with them.  Perform in a pain free range of motion.   Theraband from easy to hard:  Yellow (easiest) Red     Inversion: Resisted   Cross legs with right leg underneath, foot in tubing loop. Hold tubing around other foot to resist and turn foot in. Repeat __5__ times per set. Do _1___ sets per session. Do __2__ sessions per day.  http://orth.exer.us/12   Copyright  VHI. All rights reserved.  Eversion: Resisted   With right foot in tubing loop, hold tubing around other foot to resist and turn foot out. Repeat __5__ times per set. Do __1__ sets per session. Do _2___ sessions per day.  http://orth.exer.us/14   Copyright  VHI. All rights reserved.  Plantar Flexion: Resisted   Anchor behind, tubing around left foot, press down. Repeat __5__ times per set. Do _1___ sets per session. Do __2__ sessions per day.  http://orth.exer.us/10   Copyright  VHI. All rights reserved.  Dorsiflexion: Resisted   Facing anchor, tubing around left foot, pull toward face.  Repeat __5__ times per set. Do __1__ sets per session. Do _2___ sessions per day.  http://orth.exer.us/8   Copyright  VHI. All rights reserved.      When you're better able to weight bear on your feet:   Hold on to something sturdy    Perform tip toes   10 times   Perform 2 to 3 sets per day.

## 2016-09-04 NOTE — Therapy (Signed)
[facility] PHYSICAL AND SPORTS MEDICINE [address], [place], [address] Phone: [phone]   Fax:  [phone]  Physical Therapy Evaluation  Patient Details  Name: Donna Caldwell MRN: [id number] Date of Birth: 05-Feb-1991 (26) Referring Provider: Lanier Ensign, MD  Encounter Date: 09/04/2016      PT End of Session - 09/04/16 1532    Visit Number 1   Number of Visits 1   Date for PT Re-Evaluation 09/04/16   Authorization Type 1   Authorization Time Period of 1 Medicaid   PT Start Time 1533   PT Stop Time 1649   PT Time Calculation (min) 76 min   Activity Tolerance Patient tolerated treatment well   Behavior During Therapy [facility] for tasks assessed/performed      Past Medical History:  Diagnosis Date  . Anemia   . Anxiety   . Asthma    allergies/asthma  . Bronchitis   . Depression     Past Surgical History:  Procedure Laterality Date  . CESAREAN SECTION N/A 03/07/2015   Procedure: CESAREAN SECTION;  Surgeon: Vena Austria, MD;  Location: [facility] ORS;  Service: Obstetrics;  Laterality: N/A;  . NO PAST SURGERIES    . wisdom teth Bilateral     There were no vitals filed for this visit.       Subjective Assessment - 09/04/16 1537    Subjective L ankle pain: no pain in sitting but can tell that it is not normal; 1/10 L ankle discomfort when walking about 30 ft, 6/10 L ankle pain/discomfort at worst for the past week (after work). R ankle 0/10 currently (pt sitting) and walking 30 ft; 3/10 at worst for the past 7 days.    Pertinent History L ankle pain since August 18, 2016. Pt was playing soccer and her ankle started bothering her and noticed bruising. Her R ankle bothered her as well. Both ankles were a little swollen but the L one had the bruise and therefore had an X-ray for it. No fractures but pt had bone spurs (does not know where). She was told that she had a bad sprain on the L side. Pt states that she sprained her R ankle as  well. Both ankles are better since injury.  Had a brace that she was supposed to wear but it was rubbing her L ankle causing iritation. Was told to wear an ace bandage on her R ankle. Able to walk without her brace but pain increases as the day progresses.  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    Patient Stated Goals To be able to play soccer.   Currently in Pain? Yes   Pain Score --  see "Subjective"   Pain Location Ankle   Pain Orientation Left;Right   Pain Descriptors / Indicators Aching;Numbness;Shooting;Dull   Pain Type Acute pain   Pain Onset 1 to 4 weeks ago   Pain Frequency Occasional   Aggravating Factors  prolonged walking, walking on uneven surfaces, walking on inclines (downhill is worse that walking uphill)   Pain Relieving Factors sitting and resting, propping her feet up, ice.             [facility] PT Assessment - 09/04/16 1521      Assessment   Medical Diagnosis L and R ankle pain   Referring Provider Lanier Ensign, MD  Onset Date/Surgical Date 08/18/16   Prior Therapy No known PT for current condition     Precautions   Precaution Comments No known precautions     Restrictions   Other Position/Activity Restrictions No known restrictions     Balance Screen   Has the patient fallen in the past 6 months Yes   How many times? 1   Has the patient had a decrease in activity level because of a fear of falling?  No   Is the patient reluctant to leave their home because of a fear of falling?  No     Home Environment   Additional Comments Patient lives in a 2 story home with family, 5 steps to enter with R rail. 15 steps inside with L rail      Prior Function   Vocation Full time employment  sales person   Vocation Requirements PLOF: able to walk for longer periods on even and uneven surfaces with less ankle pain   Leisure play soccer,  spend time with her daughter, friends and family     Observation/Other Assessments   Observations Discomfort with L ankle IR. Otherwise, no pain with bilateral ankle IV, PF with IV, anterior drawer, medial and lateral glide of talus at subtalar joint, Leg squeeze (to check syndesmosis). No swelling or bruising observed, no TTP at lateral ankles. Slight TTP L medial malleolus.    Lower Extremity Functional Scale  50/80     Posture/Postural Control   Posture Comments Bilateral foot pronation, R weight shifting, slight increased lumbar lordosis, bilaterally protracted shoulders, R lateral shift.      AROM   Right Ankle Dorsiflexion -15   Right Ankle Plantar Flexion 61   Right Ankle Inversion 25   Right Ankle Eversion 18   Left Ankle Dorsiflexion -12   Left Ankle Plantar Flexion 54   Left Ankle Inversion 18   Left Ankle Eversion 15  discomfort medial leg     Strength   Right Hip Extension 4/5   Right Hip ABduction 5/5   Left Hip Extension 4/5   Left Hip ABduction 5/5   Right Ankle Dorsiflexion 5/5   Right Ankle Plantar Flexion 5/5   Right Ankle Inversion 5/5   Right Ankle Eversion 5/5   Left Ankle Dorsiflexion 4+/5   Left Ankle Plantar Flexion --  able to perform 2 heel raises in standing with 2 UE assist   Left Ankle Inversion 4+/5   Left Ankle Eversion 4/5     Palpation   Palpation comment TTP L ATFL and L medial malleolus     Ambulation/Gait   Gait Comments antalgic, decreased stance L LE, ipsilateral lateral lean on stance LE.        Objectives  There-ex  Supine ankle EV 10x2. Decreased medial leg discomfort  Ankle DF 6x  IV 10x  Seated ankle DF with soccer ball assist 10x5 seconds   Reviewed HEP. Please see pt instructions. Handout provided. Pt demonstrated and verbalized understanding.    Also gave ankle AROM all planes (DF, PF, EV, IV) 10x3 each way daily to promote ROM and movement as part of her HEP. Pt demonstrated and verbalized understanding.      Improved exercise technique, movement at target joints, use of target muscles after mod verbal, visual, tactile cues.     Manual therapy  Gentle sustained A to P pressure to L talus in supine to decrease posterior stiffness.  PT Education - 09/04/16 1811    Education provided Yes   Education Details ther-ex, HEP   Person(s) Educated Patient   Methods Explanation;Demonstration;Tactile cues;Handout;Verbal cues   Comprehension Returned demonstration;Verbalized understanding          PT Short Term Goals - 09/04/16 1757      PT SHORT TERM GOAL #1   Title Patient will be independent with her HEP to promote ankle ROM, strength, function.    Baseline Pt demonstrate and verbalized understanding with her HEP.    Time 1   Period Days   Status Achieved                  Plan - 09/04/16 1656    Clinical Impression Statement Patient is a 26 year old female who came to physical therapy secondary to bilateral ankle pain, L > R. She also presents with limited L ankle ROM, TTP to her L ATFL and medial malleolus, L ankle weakness compared to her R, antalgic gait pattern with decreased stance to her L LE during L LE stance phase, and difficulty performing functional tasks such as walking on uneven surfaces, and standing and walking for long periods at work.  Symptoms have improved per pt reports since onset of injury.  Pt was given a home exercise program to help address the aforementioned deficits secondary to her insurance only allowing one visit.    Rehab Potential Good   Clinical Impairments Affecting Rehab Potential (-) insurance only allows for 1 visit; (+) young age, motivated.    PT Frequency One time visit  secondary to Medicaid only allowing 1 visit   PT Treatment/Interventions Therapeutic exercise;Manual techniques   PT Next Visit Plan Continue with HEP   Consulted and Agree with Plan of Care Patient      Patient will benefit from skilled  therapeutic intervention in order to improve the following deficits and impairments:  Pain, Decreased range of motion, Difficulty walking, Hypomobility, Decreased strength  Visit Diagnosis: Pain in left ankle and joints of left foot - Plan: PT plan of care cert/re-cert  Pain in right ankle and joints of right foot - Plan: PT plan of care cert/re-cert  Difficulty in walking, not elsewhere classified - Plan: PT plan of care cert/re-cert     Problem List Patient Active Problem List   Diagnosis Date Noted  . Pregnancy 03/06/2015  . Postcoital bleeding 02/26/2015    Loralyn Freshwater PT, DPT   09/04/2016, 6:27 PM  [facility] PHYSICAL AND SPORTS MEDICINE [address] [place], [address] Phone: [phone]   Fax:  [phone]  Name: Julya Alioto MRN: [id number] Date of Birth: Jan 17, 1991

## 2017-08-22 ENCOUNTER — Ambulatory Visit (INDEPENDENT_AMBULATORY_CARE_PROVIDER_SITE_OTHER): Payer: Self-pay | Admitting: Podiatry

## 2017-08-22 ENCOUNTER — Ambulatory Visit: Payer: Medicaid Other

## 2017-08-22 ENCOUNTER — Encounter: Payer: Self-pay | Admitting: Podiatry

## 2017-08-22 DIAGNOSIS — M722 Plantar fascial fibromatosis: Secondary | ICD-10-CM

## 2017-08-22 MED ORDER — MELOXICAM 15 MG PO TABS: 15.0000 mg | ORAL_TABLET | Freq: Every day | ORAL | 1 refills | Status: AC

## 2017-08-27 NOTE — Progress Notes (Signed)
   Subjective: Patient presents today for pain and tenderness in the heels and medial aspects of the feet bilaterally that began about 6 months ago. She states the left foot hurts worse than the right. She states that it hurts in the morning with the first steps out of bed. She has been doing water bottle exercises and taking Ibuprofen for treatment. Patient presents today for further treatment and evaluation.  Past Medical History:  Diagnosis Date  . Anemia   . Anxiety   . Asthma    allergies/asthma  . Bronchitis   . Depression      Objective: Physical Exam General: The patient is alert and oriented x3 in no acute distress.  Dermatology: Skin is warm, dry and supple bilateral lower extremities. Negative for open lesions or macerations bilateral.   Vascular: Dorsalis Pedis and Posterior Tibial pulses palpable bilateral.  Capillary fill time is immediate to all digits.  Neurological: Epicritic and protective threshold intact bilateral.   Musculoskeletal: Tenderness to palpation to the plantar aspect of the bilateral heels along the plantar fascia. All other joints range of motion within normal limits bilateral. Strength 5/5 in all groups bilateral.   Assessment: 1. plantar fasciitis bilateral feet  Plan of Care:  1. Patient evaluated.  2. Injection of 0.5cc Celestone soluspan injected into the bilateral heels.  3. Rx for Meloxicam provided to patient.  4. Recommended OTC insoles.  5. Instructed patient regarding therapies and modalities at home to alleviate symptoms.  6. Return to clinic as needed.    Felecia ShellingBrent M. Shine Mikes, DPM [facility]  Dr. Felecia ShellingBrent M. Raeya Merritts, DPM    [address]                                   [place], [place] [address]                Office [phone]  Fax [phone]

## 2019-10-25 ENCOUNTER — Other Ambulatory Visit: Payer: Self-pay | Admitting: Family Medicine

## 2019-10-25 DIAGNOSIS — Z3480 Encounter for supervision of other normal pregnancy, unspecified trimester: Secondary | ICD-10-CM

## 2019-10-28 ENCOUNTER — Ambulatory Visit
Admission: RE | Admit: 2019-10-28 | Discharge: 2019-10-28 | Disposition: A | Discharge status: Home / Self Care | Payer: Medicaid Other | Source: Ambulatory Visit | Attending: Family Medicine | Admitting: Family Medicine

## 2019-10-28 ENCOUNTER — Other Ambulatory Visit: Payer: Self-pay

## 2019-10-28 DIAGNOSIS — Z3480 Encounter for supervision of other normal pregnancy, unspecified trimester: Secondary | ICD-10-CM | POA: Diagnosis present

## 2019-12-22 DIAGNOSIS — O99210 Obesity complicating pregnancy, unspecified trimester: Secondary | ICD-10-CM | POA: Insufficient documentation

## 2020-03-24 DIAGNOSIS — O24419 Gestational diabetes mellitus in pregnancy, unspecified control: Secondary | ICD-10-CM | POA: Insufficient documentation

## 2020-05-16 DIAGNOSIS — O34219 Maternal care for unspecified type scar from previous cesarean delivery: Secondary | ICD-10-CM | POA: Insufficient documentation

## 2021-01-07 ENCOUNTER — Other Ambulatory Visit: Payer: Self-pay

## 2021-01-07 ENCOUNTER — Ambulatory Visit
Admission: EM | Admit: 2021-01-07 | Discharge: 2021-01-07 | Disposition: A | Discharge status: Home / Self Care | Payer: Medicaid Other | Attending: Internal Medicine | Admitting: Internal Medicine

## 2021-01-07 ENCOUNTER — Encounter: Payer: Self-pay | Admitting: Emergency Medicine

## 2021-01-07 DIAGNOSIS — J069 Acute upper respiratory infection, unspecified: Secondary | ICD-10-CM | POA: Diagnosis not present

## 2021-01-07 DIAGNOSIS — U071 COVID-19: Secondary | ICD-10-CM | POA: Insufficient documentation

## 2021-01-07 NOTE — ED Provider Notes (Signed)
[facility]    CSN: [id number] Arrival date & time: 01/07/21  1241      History   Chief Complaint Chief Complaint  Patient presents with   Sinus Problem   Nasal Congestion    HPI Donna Caldwell is a 30 y.o. female who presents with nose congestion and post nasal drainage that started last night. She denies having a fever. Her son who has been having fevers and URI has been exposed to covid at day care.     Past Medical History:  Diagnosis Date   Anemia    Anxiety    Asthma    allergies/asthma   Bronchitis    Depression     Patient Active Problem List   Diagnosis Date Noted   Pregnancy 03/06/2015   Postcoital bleeding 02/26/2015    Past Surgical History:  Procedure Laterality Date   CESAREAN SECTION N/A 03/07/2015   Procedure: CESAREAN SECTION;  Surgeon: Vena Austria, MD;  Location: [facility] ORS;  Service: Obstetrics;  Laterality: N/A;   NO PAST SURGERIES     wisdom teth Bilateral     OB History     Gravida  1   Para  1   Term  1   Preterm      AB      Living  0      SAB      IAB      Ectopic      Multiple  0   Live Births               Home Medications    Prior to Admission medications   Medication Sig Start Date End Date Taking? Authorizing Provider  ibuprofen (ADVIL,MOTRIN) 200 MG tablet Take 200 mg by mouth every 6 (six) hours as needed.    [provider]    Family History History reviewed. No pertinent family history.  Social History Social History   Tobacco Use   Smoking status: Former    Packs/day: 0.50    Years: 10.00    Pack years: 5.00    Types: Cigarettes   Smokeless tobacco: Never  Vaping Use   Vaping Use: Every day  Substance Use Topics   Alcohol use: Yes    Comment: 2 drinks per week   Drug use: No     Allergies   Patient has no known allergies.   Review of Systems Review of Systems  Constitutional:  Negative for appetite change, chills, diaphoresis, fatigue and fever.   HENT:  Positive for congestion and sinus pain. Negative for ear discharge, ear pain, rhinorrhea and sore throat.   Eyes:  Negative for discharge.  Respiratory:  Positive for cough. Negative for chest tightness and shortness of breath.   Cardiovascular:  Negative for chest pain.  Musculoskeletal:  Negative for gait problem.  Skin:  Negative for rash.  Hematological:  Negative for adenopathy.    Physical Exam Triage Vital Signs ED Triage Vitals  Enc Vitals Group     BP 01/07/21 1336 (!) 150/90     Pulse Rate 01/07/21 1336 (!) 102     Resp 01/07/21 1336 14     Temp 01/07/21 1336 99.1 F (37.3 C)     Temp Source 01/07/21 1336 Oral     SpO2 01/07/21 1336 98 %     Weight 01/07/21 1331 290 lb (131.5 kg)     Height 01/07/21 1331 5\' 2"  (1.575 m)     Head Circumference --  Peak Flow --      Pain Score 01/07/21 1331 0     Pain Loc --      Pain Edu? --      Excl. in GC? --    No data found.  Updated Vital Signs BP (!) 150/90 (BP Location: Left Arm)   Pulse (!) 102   Temp 99.1 F (37.3 C) (Oral)   Resp 14   Ht 5\' 2"  (1.575 m)   Wt 290 lb (131.5 kg)   SpO2 98%   Breastfeeding No   BMI 53.04 kg/m   Visual Acuity Right Eye Distance:   Left Eye Distance:   Bilateral Distance:    Right Eye Near:   Left Eye Near:    Bilateral Near:     Physical Exam Vitals and nursing note reviewed.  Physical Exam Vitals signs and nursing note reviewed.  Constitutional:      General: She is not in acute distress.    Appearance: Normal appearance. She is not ill-appearing, toxic-appearing or diaphoretic.  HENT:     Head: Normocephalic.     Right Ear: Tympanic membrane, ear canal and external ear normal.     Left Ear: Tympanic membrane, ear canal and external ear normal.     Nose: Nose normal.     Mouth/Throat:     Mouth: Mucous membranes are moist.  Eyes:     General: No scleral icterus.       Right eye: No discharge.        Left eye: No discharge.     Conjunctiva/sclera:  Conjunctivae normal.  Neck:     Musculoskeletal: Neck supple. No neck rigidity.  Cardiovascular:     Rate and Rhythm: Normal rate and regular rhythm.     Heart sounds: No murmur.  Pulmonary:     Effort: Pulmonary effort is normal.     Breath sounds: Normal breath sounds.    Musculoskeletal: Normal range of motion.  Lymphadenopathy:     Cervical: No cervical adenopathy.  Skin:    General: Skin is warm and dry.     Coloration: Skin is not jaundiced.     Findings: No rash.  Neurological:     Mental Status: She is alert and oriented to person, place, and time.     Gait: Gait normal.  Psychiatric:        Mood and Affect: Mood normal.        Behavior: Behavior normal.        Thought Content: Thought content normal.        Judgment: Judgment normal.     UC Treatments / Results  Labs (all labs ordered are listed, but only abnormal results are displayed) Labs Reviewed  SARS CORONAVIRUS 2 (TAT 6-24 HRS)    EKG   Radiology No results found.  Procedures Procedures (including critical care time)  Medications Ordered in UC Medications - No data to display  Initial Impression / Assessment and Plan / UC Course  I have reviewed the triage vital signs and the nursing notes. Early URI Covid test is pending. We will call her if positive.  May take Dayquil or Nyaquil prn.      Final Clinical Impressions(s) / UC Diagnoses   Final diagnoses:  None   Discharge Instructions   None    ED Prescriptions   None    PDMP not reviewed this encounter.   , PA-C 01/07/21 1416

## 2021-01-07 NOTE — ED Triage Notes (Signed)
Patient c/o sinus drainage and nasal congestion that started last night.  Patient denies fevers.

## 2021-01-08 LAB — SARS CORONAVIRUS 2 (TAT 6-24 HRS): SARS Coronavirus 2: POSITIVE — AB

## 2021-10-16 IMAGING — US US OB < 14 WEEKS - US OB TV
1 series · 14 of 28 positions shown · non-contrast
Comparison: None

CLINICAL DATA: Supervision of normal pregnancy in multigravida,
first trimester, unknown LMP

EXAM:
OBSTETRIC <14 WK US AND TRANSVAGINAL OB US
TECHNIQUE: Both transabdominal and transvaginal ultrasound examinations were
performed for complete evaluation of the gestation as well as the
maternal uterus, adnexal regions, and pelvic cul-de-sac.
Transvaginal technique was performed to assess early pregnancy.

[Series 1: us ob less than 14 weeks with ob transvaginal · 14 of 105 slices shown]
[im 4/105]
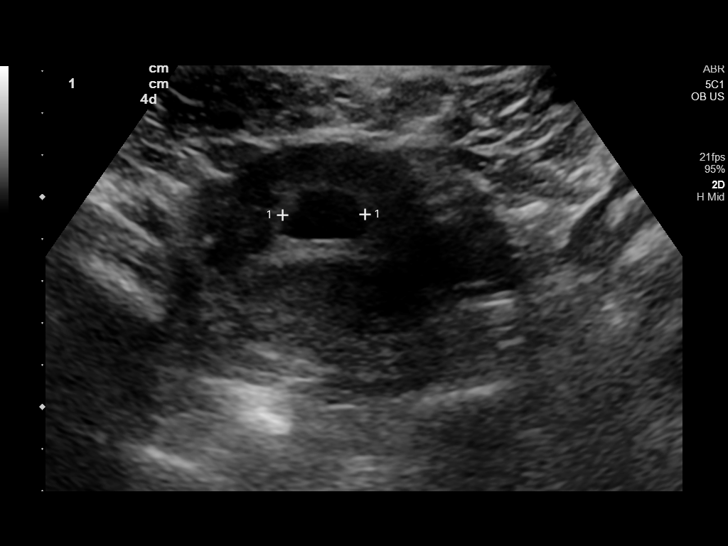
[im 12/105]
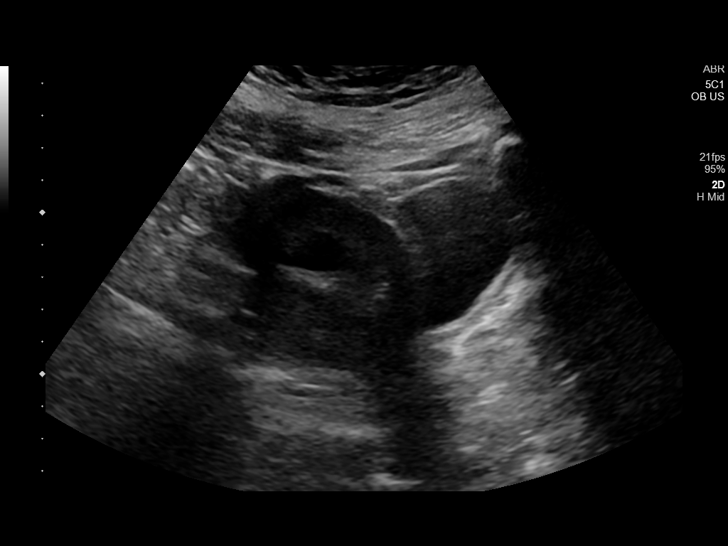
[im 20/105]
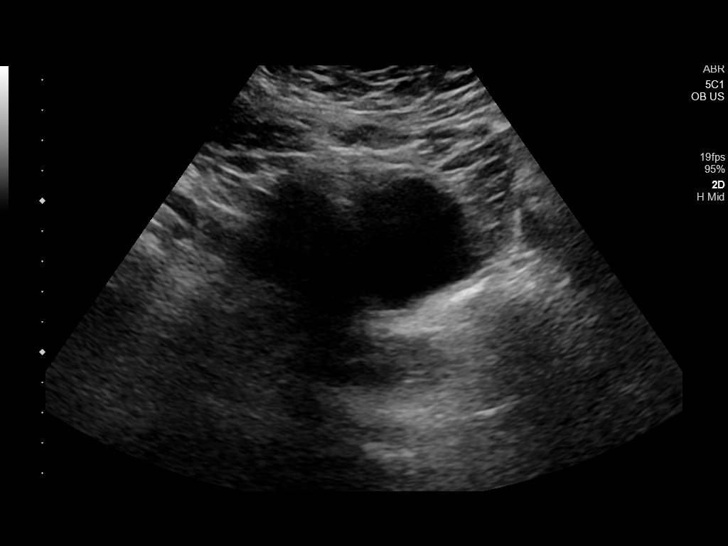
[im 27/105]
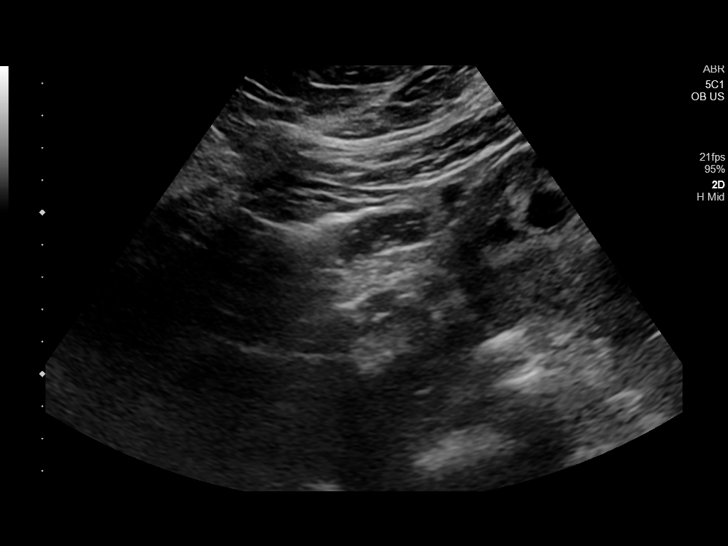
[im 35/105]
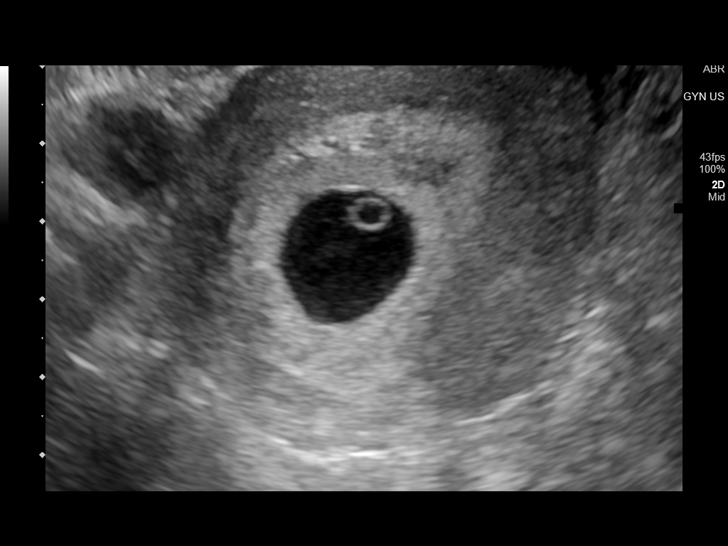
[im 43/105]
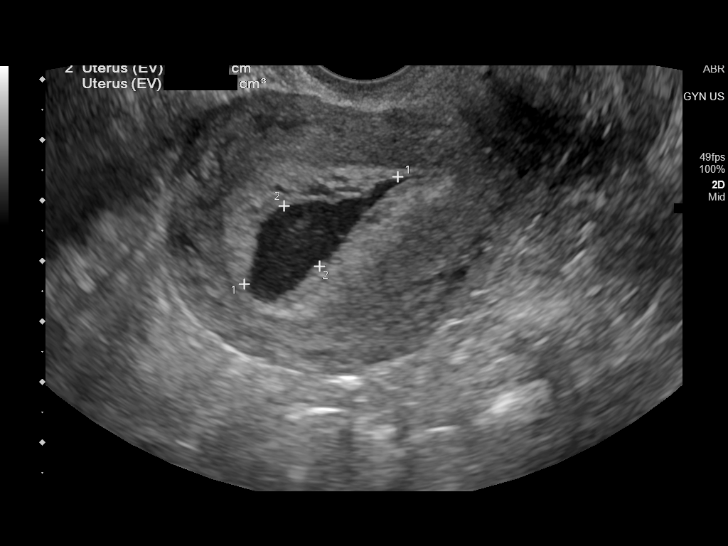
[im 51/105]
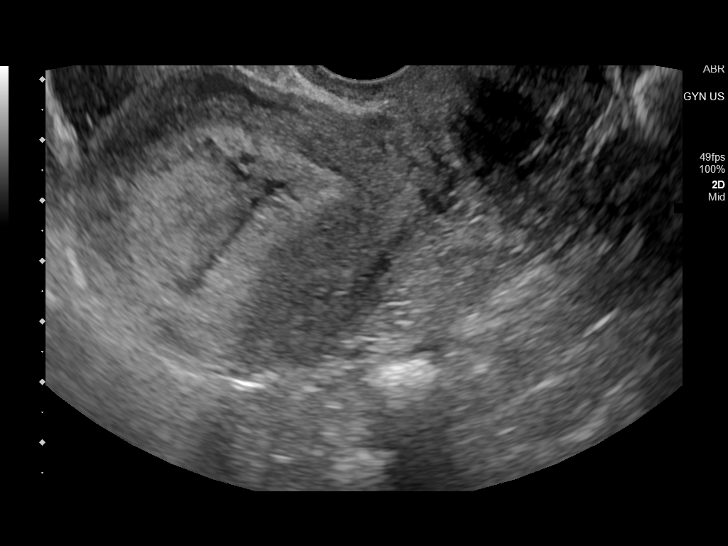
[im 58/105]
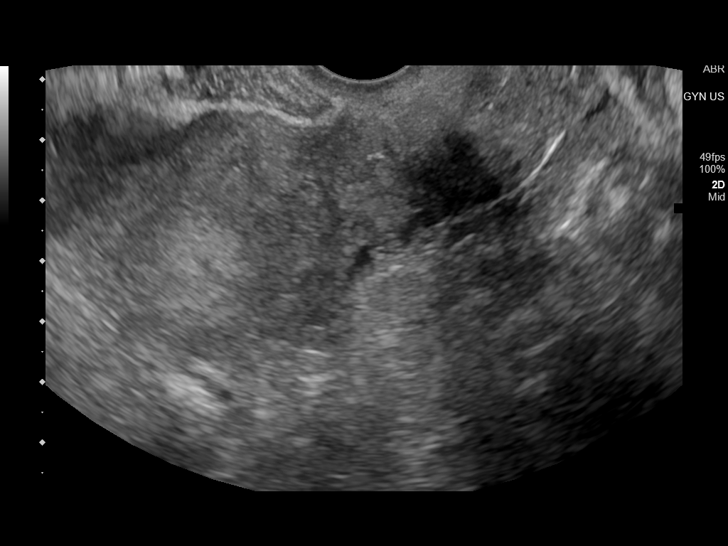
[im 66/105]
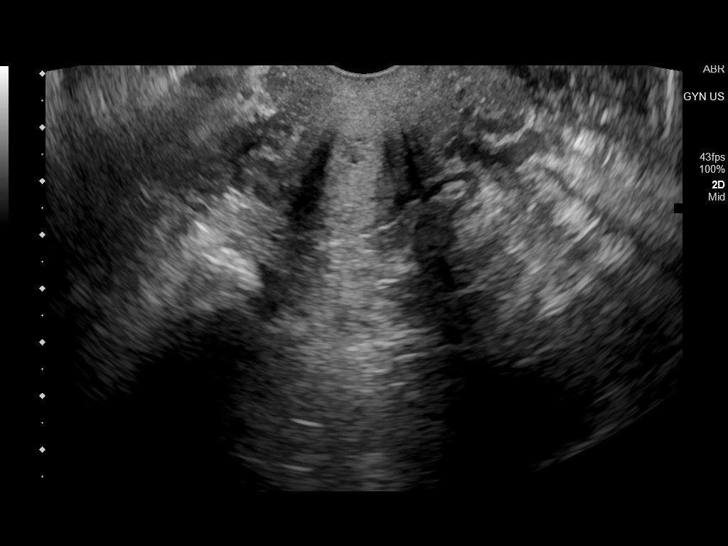
[im 74/105]
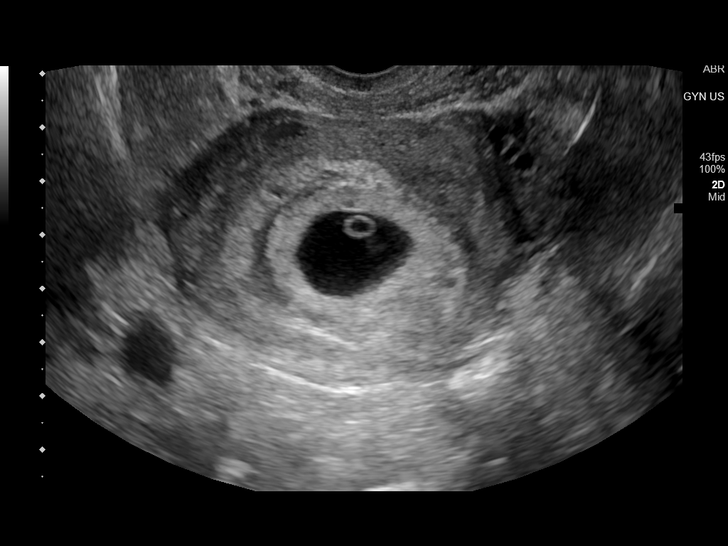
[im 81/105]
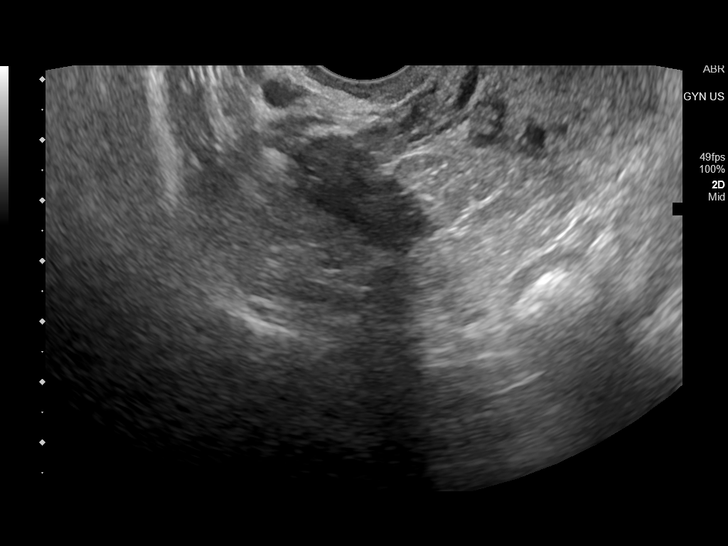
[im 89/105]
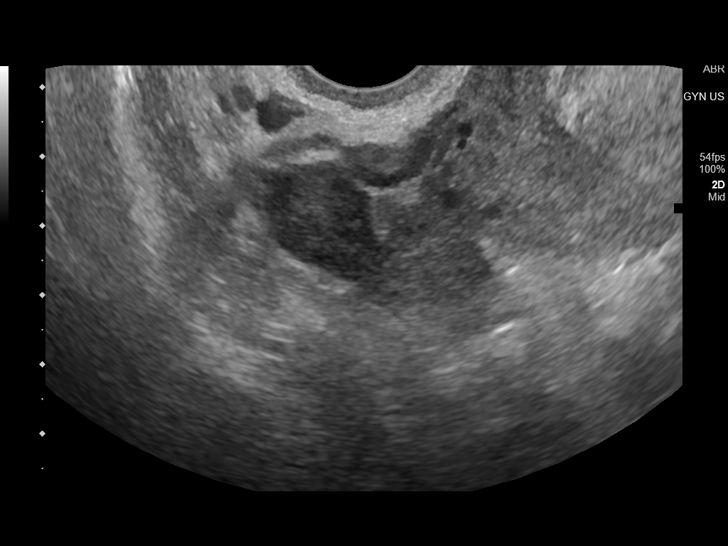
[im 97/105]
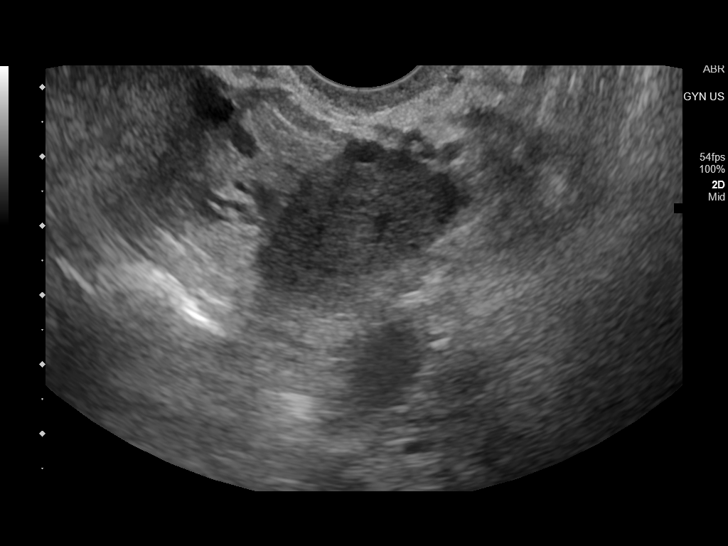
[im 105/105]
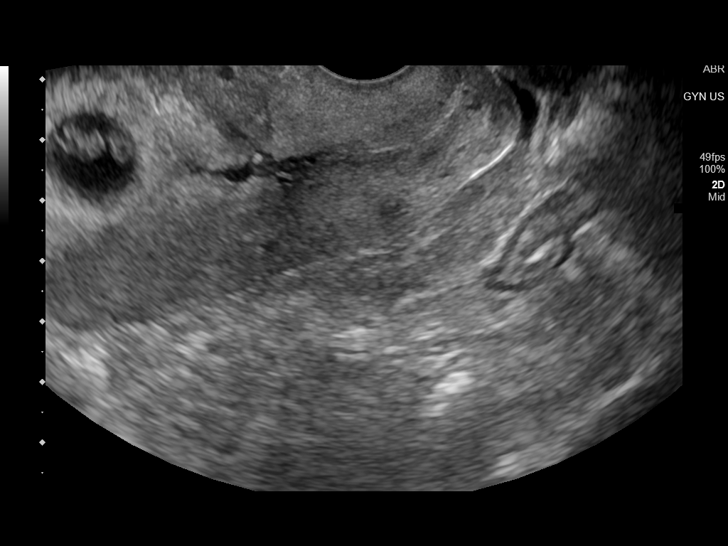

[14 of 28 positions shown; findings below may reference images not displayed]

FINDINGS: Intrauterine gestational sac: Present, single

Yolk sac:  Present

Embryo:  Present

Cardiac Activity: Present

Heart Rate: 135 bpm

CRL:  9.4 mm   7 w   0 d                  US EDC: 06/15/2020

Subchorionic hemorrhage: Large subchronic hemorrhage 31 x 12 x 8 mm.

Maternal uterus/adnexae:

Maternal uterus otherwise unremarkable.

RIGHT ovary normal size and morphology 2.7 x 1.3 x 1.5 cm.

LEFT ovary measures 3.6 x 2.4 x 2.3 cm and contains a small corpus
luteum.

No adnexal masses.

Trace free pelvic fluid.
IMPRESSION: Single live intrauterine gestation at 7 weeks 0 days EGA by
crown-rump length.

Large subchronic hemorrhage.

## 2022-01-16 ENCOUNTER — Other Ambulatory Visit: Payer: Self-pay | Admitting: Family Medicine

## 2022-01-16 ENCOUNTER — Ambulatory Visit
Admission: RE | Admit: 2022-01-16 | Discharge: 2022-01-16 | Disposition: A | Discharge status: Home / Self Care | Payer: Medicaid Other | Source: Ambulatory Visit | Attending: Family Medicine | Admitting: Family Medicine

## 2022-01-16 DIAGNOSIS — R1319 Other dysphagia: Secondary | ICD-10-CM

## 2022-01-28 ENCOUNTER — Encounter: Payer: Self-pay | Admitting: Family Medicine

## 2022-02-06 ENCOUNTER — Encounter: Payer: Self-pay | Admitting: Gastroenterology

## 2022-02-06 ENCOUNTER — Ambulatory Visit (INDEPENDENT_AMBULATORY_CARE_PROVIDER_SITE_OTHER): Payer: Medicaid Other | Admitting: Gastroenterology

## 2022-02-06 ENCOUNTER — Other Ambulatory Visit: Payer: Self-pay

## 2022-02-06 VITALS — BP 113/79 | HR 89 | Temp 98.2°F | Ht 62.0 in | Wt 284.1 lb

## 2022-02-06 DIAGNOSIS — R1319 Other dysphagia: Secondary | ICD-10-CM

## 2022-02-06 DIAGNOSIS — K219 Gastro-esophageal reflux disease without esophagitis: Secondary | ICD-10-CM | POA: Diagnosis not present

## 2022-02-06 NOTE — Progress Notes (Signed)
Donna Repress, MD [address]  [address]  [place], [place] [address]  Main: [phone]  Fax: [phone]    Gastroenterology Consultation  Referring Provider:     [facility], [facility] Primary Care Physician:  [facility], [facility] Primary Gastroenterologist:  Dr. Arlyss Caldwell Reason for Consultation: Dysphagia, chronic GERD        HPI:   Donna Caldwell is a 31 y.o. female referred by Center, [facility]  for consultation & management of dysphagia, chronic GERD.  Patient reports long-term history of acid reflux, occasional nocturnal heartburn for which she takes Tums, Pepcid as needed.  Patient was evaluated by her PCP and last week of August as she was experiencing difficulty swallowing, chest pain.  Chest x-ray was negative.  CBC and BMP, TSH were unremarkable.  Therefore, referred to GI for further evaluation.  Patient reports that her symptoms have resolved at this time but she wanted to keep the appointment for.  Patient denies any symptoms currently.  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  She reports that she is not able to have time for herself to exercise.  She just grabs and eats what ever is available for her and unable to afford healthy foods.  Patient became tearful when I was trying to explain to her that it is important to maintain healthy lifestyle.  She says, she does not have any support to take care of her children.  She does not smoke or drink alcohol  NSAIDs: None  Antiplts/Anticoagulants/Anti thrombotics: None  GI Procedures: None  Past Medical History:  Diagnosis Date   Anemia    Anxiety    Asthma    allergies/asthma   Bronchitis    Depression     Past Surgical History:  Procedure Laterality Date   CESAREAN SECTION N/A 03/07/2015   Procedure: CESAREAN SECTION;  Surgeon: Vena Austria, MD;  Location: [facility] ORS;  Service: Obstetrics;  Laterality: N/A;   NO PAST  SURGERIES     wisdom teth Bilateral     No current outpatient medications on file.   History reviewed. No pertinent family history.   Social History   Tobacco Use   Smoking status: Former    Packs/day: 0.50    Years: 10.00    Total pack years: 5.00    Types: Cigarettes   Smokeless tobacco: Never  Vaping Use   Vaping Use: Every day  Substance Use Topics   Alcohol use: Yes    Comment: 2 drinks per week   Drug use: No    Allergies as of 02/06/2022   (No Known Allergies)    Review of Systems:    All systems reviewed and negative except where noted in HPI.   Physical Exam:  BP 113/79 (BP Location: Left Arm, Patient Position: Sitting, Cuff Size: Large)   Pulse 89   Temp 98.2 F (36.8 C) (Oral)   Ht 5\' 2"  (1.575 m)   Wt 284 lb 2 oz (128.9 kg)   LMP 01/10/2022   BMI 51.97 kg/m  Patient's last menstrual period was 01/10/2022.  General:   Alert,  Well-developed, well-nourished, pleasant and cooperative in NAD Head:  Normocephalic and atraumatic. Eyes:  Sclera clear, no icterus.   Conjunctiva pink. Ears:  Normal auditory acuity. Nose:  No deformity, discharge, or lesions. Mouth:  No deformity or lesions,oropharynx pink & moist. Neck:  Supple; no masses or thyromegaly. Lungs:  Respirations  even and unlabored.  Clear throughout to auscultation.   No wheezes, crackles, or rhonchi. No acute distress. Heart:  Regular rate and rhythm; no murmurs, clicks, rubs, or gallops. Abdomen:  Normal bowel sounds. Soft, obese, non-tender and non-distended without masses, hepatosplenomegaly or hernias noted.  No guarding or rebound tenderness.   Rectal: Not performed Msk:  Symmetrical without gross deformities. Good, equal movement & strength bilaterally. Pulses:  Normal pulses noted. Extremities:  No clubbing or edema.  No cyanosis. Neurologic:  Alert and oriented x3;  grossly normal neurologically. Skin:  Intact without significant lesions or rashes. No jaundice. Psych:  Alert and  cooperative. Normal mood and affect.  Imaging Studies: No abdominal imaging  Assessment and Plan:   [name] is a 31 y.o. Caucasian female with morbid obesity, BMI 52 is seen in consultation for chronic GERD and dysphagia.  Dysphagia was transient, currently resolved.  Discussed with patient regarding GERD lifestyle, information provided I do not recommend upper endoscopy at this time unless her symptoms recur Advised patient to try over-the-counter Prilosec 20 mg twice daily before meals for 2 weeks if she has regular symptoms of reflux   Follow up as needed, contact via MyChart if needed   Cephas Darby, MD

## 2022-02-06 NOTE — Patient Instructions (Signed)
Gastroesophageal Reflux Disease, Adult  Gastroesophageal reflux (GER) happens when acid from the stomach flows up into the tube that connects the mouth and the stomach (esophagus). Normally, food travels down the esophagus and stays in the stomach to be digested. With GER, food and stomach acid sometimes move back up into the esophagus. You may have a disease called gastroesophageal reflux disease (GERD) if the reflux: Happens often. Causes frequent or very bad symptoms. Causes problems such as damage to the esophagus. When this happens, the esophagus becomes sore and swollen. Over time, GERD can make small holes (ulcers) in the lining of the esophagus. What are the causes? This condition is caused by a problem with the muscle between the esophagus and the stomach. When this muscle is weak or not normal, it does not close properly to keep food and acid from coming back up from the stomach. The muscle can be weak because of: Tobacco use. Pregnancy. Having a certain type of hernia (hiatal hernia). Alcohol use. Certain foods and drinks, such as coffee, chocolate, onions, and peppermint. What increases the risk? Being overweight. Having a disease that affects your connective tissue. Taking NSAIDs, such a ibuprofen. What are the signs or symptoms? Heartburn. Difficult or painful swallowing. The feeling of having a lump in the throat. A bitter taste in the mouth. Bad breath. Having a lot of saliva. Having an upset or bloated stomach. Burping. Chest pain. Different conditions can cause chest pain. Make sure you see your doctor if you have chest pain. Shortness of breath or wheezing. A long-term cough or a cough at night. Wearing away of the surface of teeth (tooth enamel). Weight loss. How is this treated? Making changes to your diet. Taking medicine. Having surgery. Treatment will depend on how bad your symptoms are. Follow these instructions at home: Eating and drinking  Follow a  diet as told by your doctor. You may need to avoid foods and drinks such as: Coffee and tea, with or without caffeine. Drinks that contain alcohol. Energy drinks and sports drinks. Bubbly (carbonated) drinks or sodas. Chocolate and cocoa. Peppermint and mint flavorings. Garlic and onions. Horseradish. Spicy and acidic foods. These include peppers, chili powder, curry powder, vinegar, hot sauces, and BBQ sauce. Citrus fruit juices and citrus fruits, such as oranges, lemons, and limes. Tomato-based foods. These include red sauce, chili, salsa, and pizza with red sauce. Fried and fatty foods. These include donuts, french fries, potato chips, and high-fat dressings. High-fat meats. These include hot dogs, rib eye steak, sausage, ham, and bacon. High-fat dairy items, such as whole milk, butter, and cream cheese. Eat small meals often. Avoid eating large meals. Avoid drinking large amounts of liquid with your meals. Avoid eating meals during the 2-3 hours before bedtime. Avoid lying down right after you eat. Do not exercise right after you eat. Lifestyle  Do not smoke or use any products that contain nicotine or tobacco. If you need help quitting, ask your doctor. Try to lower your stress. If you need help doing this, ask your doctor. If you are overweight, lose an amount of weight that is healthy for you. Ask your doctor about a safe weight loss goal. General instructions Pay attention to any changes in your symptoms. Take over-the-counter and prescription medicines only as told by your doctor. Do not take aspirin, ibuprofen, or other NSAIDs unless your doctor says it is okay. Wear loose clothes. Do not wear anything tight around your waist. Raise (elevate) the head of your bed about   6 inches (15 cm). You may need to use a wedge to do this. Avoid bending over if this makes your symptoms worse. Keep all follow-up visits. Contact a doctor if: You have new symptoms. You lose weight and you  do not know why. You have trouble swallowing or it hurts to swallow. You have wheezing or a cough that keeps happening. You have a hoarse voice. Your symptoms do not get better with treatment. Get help right away if: You have sudden pain in your arms, neck, jaw, teeth, or back. You suddenly feel sweaty, dizzy, or light-headed. You have chest pain or shortness of breath. You vomit and the vomit is green, yellow, or black, or it looks like blood or coffee grounds. You faint. Your poop (stool) is red, bloody, or black. You cannot swallow, drink, or eat. These symptoms may represent a serious problem that is an emergency. Do not wait to see if the symptoms will go away. Get medical help right away. Call your local emergency services (911 in the [place] Do not drive yourself to the hospital. Summary If a person has gastroesophageal reflux disease (GERD), food and stomach acid move back up into the esophagus and cause symptoms or problems such as damage to the esophagus. Treatment will depend on how bad your symptoms are. Follow a diet as told by your doctor. Take all medicines only as told by your doctor. This information is not intended to replace advice given to you by your health care provider. Make sure you discuss any questions you have with your health care provider. Document Revised: 11/15/2019 Document Reviewed: 11/15/2019 Elsevier Patient Education  2023 [employer]  

## 2022-03-05 ENCOUNTER — Ambulatory Visit
Admission: EM | Admit: 2022-03-05 | Discharge: 2022-03-05 | Disposition: A | Discharge status: Home / Self Care | Payer: Medicaid Other | Attending: Internal Medicine | Admitting: Internal Medicine

## 2022-03-05 DIAGNOSIS — J029 Acute pharyngitis, unspecified: Secondary | ICD-10-CM

## 2022-03-05 DIAGNOSIS — Z1152 Encounter for screening for COVID-19: Secondary | ICD-10-CM | POA: Insufficient documentation

## 2022-03-05 LAB — GROUP A STREP BY PCR: Group A Strep by PCR: NOT DETECTED

## 2022-03-05 NOTE — ED Provider Notes (Signed)
[facility]    CSN: [id number] Arrival date & time: 03/05/22  1041      History   Chief Complaint Chief Complaint  Patient presents with   Sore Throat    HPI Donna Caldwell is a 31 y.o. female comes to the urgent care with 1 day history of sore throat.  Symptoms started yesterday and has been persistent.  She has not tried any over-the-counter medications.  She denies any nasal congestion or runny nose.  No sinus pressure.  No shortness of breath or wheezing.  No ear pain.  Patient is fully vaccinated and both against COVID-19 virus.  She contracted COVID-19 sometime last year.  As a child had similar symptoms a week ago and he has recovered from that. HPI  Past Medical History:  Diagnosis Date   Anemia    Anxiety    Asthma    allergies/asthma   Bronchitis    Depression     Patient Active Problem List   Diagnosis Date Noted   Previous cesarean delivery affecting pregnancy 05/16/2020   Gestational diabetes mellitus (GDM) in third trimester 03/24/2020    Past Surgical History:  Procedure Laterality Date   CESAREAN SECTION N/A 03/07/2015   Procedure: CESAREAN SECTION;  Surgeon: Malachy Mood, MD;  Location: [facility] ORS;  Service: Obstetrics;  Laterality: N/A;   NO PAST SURGERIES     wisdom teth Bilateral     OB History     Gravida  1   Para  1   Term  1   Preterm      AB      Living  0      SAB      IAB      Ectopic      Multiple  0   Live Births               Home Medications    Prior to Admission medications   Not on File    Family History History reviewed. No pertinent family history.  Social History Social History   Tobacco Use   Smoking status: Former    Packs/day: 0.50    Years: 10.00    Total pack years: 5.00    Types: Cigarettes   Smokeless tobacco: Never  Vaping Use   Vaping Use: Every day  Substance Use Topics   Alcohol use: Yes    Comment: 2 drinks per week   Drug use: No     Allergies    Patient has no known allergies.   Review of Systems Review of Systems  HENT:  Positive for sore throat.   Respiratory: Negative.    Cardiovascular: Negative.   Gastrointestinal: Negative.   Neurological: Negative.      Physical Exam Triage Vital Signs ED Triage Vitals  Enc Vitals Group     BP 03/05/22 1059 116/81     Pulse Rate 03/05/22 1059 74     Resp --      Temp 03/05/22 1059 98.5 F (36.9 C)     Temp Source 03/05/22 1059 Oral     SpO2 03/05/22 1059 97 %     Weight 03/05/22 1056 280 lb (127 kg)     Height 03/05/22 1056 5\' 2"  (1.575 m)     Head Circumference --      Peak Flow --      Pain Score 03/05/22 1056 2     Pain Loc --      Pain Edu? --  Excl. in GC? --    No data found.  Updated Vital Signs BP 116/81 (BP Location: Left Arm)   Pulse 74   Temp 98.5 F (36.9 C) (Oral)   Ht 5\' 2"  (1.575 m)   Wt 127 kg   LMP  (LMP Unknown)   SpO2 97%   BMI 51.21 kg/m   Visual Acuity Right Eye Distance:   Left Eye Distance:   Bilateral Distance:    Right Eye Near:   Left Eye Near:    Bilateral Near:     Physical Exam Vitals and nursing note reviewed.  Constitutional:      General: She is not in acute distress.    Appearance: She is not ill-appearing.  HENT:     Right Ear: Tympanic membrane normal.     Left Ear: Tympanic membrane normal.     Mouth/Throat:     Mouth: Mucous membranes are moist.     Pharynx: Posterior oropharyngeal erythema present.     Tonsils: 1+ on the right. 1+ on the left.  Cardiovascular:     Rate and Rhythm: Normal rate and regular rhythm.  Pulmonary:     Effort: Pulmonary effort is normal.     Breath sounds: Normal breath sounds.  Abdominal:     General: Bowel sounds are normal.     Palpations: Abdomen is soft.  Neurological:     Mental Status: She is alert.      UC Treatments / Results  Labs (all labs ordered are listed, but only abnormal results are displayed) Labs Reviewed  GROUP A STREP BY PCR  SARS CORONAVIRUS  2 (TAT 6-24 HRS)    EKG   Radiology No results found.  Procedures Procedures (including critical care time)  Medications Ordered in UC Medications - No data to display  Initial Impression / Assessment and Plan / UC Course  I have reviewed the triage vital signs and the nursing notes.  Pertinent labs & imaging results that were available during my care of the patient were reviewed by me and considered in my medical decision making (see chart for details).     1.  Acute viral pharyngitis: Strep a PCR test is negative Warm salt water gargle COVID-19 PCR test has been sent Return to urgent care if symptoms worsen We will call you with recommendations if labs are abnormal. Final Clinical Impressions(s) / UC Diagnoses   Final diagnoses:  Acute viral pharyngitis     Discharge Instructions      Warm salt water gargle Tylenol/Motrin as needed for pain and/or fever We will call you with recommendations if labs are abnormal Return to urgent care if you have any other concerns.     ED Prescriptions   None    PDMP not reviewed this encounter.   , MD 03/05/22 [phone]

## 2022-03-05 NOTE — ED Triage Notes (Signed)
Patient reports that she woke up yesterday with a sore throat and patient reports that there are white pockets in the back of her throat.

## 2022-03-05 NOTE — Discharge Instructions (Signed)
Warm salt water gargle Tylenol/Motrin as needed for pain and/or fever We will call you with recommendations if labs are abnormal Return to urgent care if you have any other concerns.

## 2022-03-06 LAB — SARS CORONAVIRUS 2 (TAT 6-24 HRS): SARS Coronavirus 2: NEGATIVE

## 2023-06-18 ENCOUNTER — Encounter: Payer: Self-pay | Admitting: Emergency Medicine

## 2023-06-18 ENCOUNTER — Ambulatory Visit
Admission: EM | Admit: 2023-06-18 | Discharge: 2023-06-18 | Disposition: A | Discharge status: Home / Self Care | Payer: Medicaid Other

## 2023-06-18 DIAGNOSIS — J069 Acute upper respiratory infection, unspecified: Secondary | ICD-10-CM

## 2023-06-18 MED ORDER — IPRATROPIUM BROMIDE 0.06 % NA SOLN: 2.0000 | Freq: Four times a day (QID) | NASAL | 12 refills | Status: AC

## 2023-06-18 NOTE — ED Provider Notes (Signed)
[facility]    CSN: [id number] Arrival date & time: 06/18/23  5284      History   Chief Complaint Chief Complaint  Patient presents with   sinus pressure   Headache   Nasal Congestion    HPI Donna Caldwell is a 33 y.o. female.   HPI  32 year old female with past medical history significant for asthma, anemia, anxiety, and depression presents for evaluation of URI symptoms that began 3 days ago.  These include a headache, nasal congestion with yellow and bloody nasal discharge, and sneezing.  She denies any fever, ear pain, sore throat, cough, or GI symptoms.  Her mother-in-law and daughter both have flu a at present.  Past Medical History:  Diagnosis Date   Anemia    Anxiety    Asthma    allergies/asthma   Bronchitis    Depression     Patient Active Problem List   Diagnosis Date Noted   Previous cesarean delivery affecting pregnancy 05/16/2020   Gestational diabetes mellitus (GDM) in third trimester 03/24/2020    Past Surgical History:  Procedure Laterality Date   CESAREAN SECTION N/A 03/07/2015   Procedure: CESAREAN SECTION;  Surgeon: Vena Austria, MD;  Location: [facility] ORS;  Service: Obstetrics;  Laterality: N/A;   NO PAST SURGERIES     wisdom teth Bilateral     OB History     Gravida  1   Para  1   Term  1   Preterm      AB      Living  0      SAB      IAB      Ectopic      Multiple  0   Live Births               Home Medications    Prior to Admission medications   Medication Sig Start Date End Date Taking? Authorizing Provider  ipratropium (ATROVENT) 0.06 % nasal spray Place 2 sprays into both nostrils 4 (four) times daily. 06/18/23  Yes Becky Augusta, NP  sertraline (ZOLOFT) 100 MG tablet Take 150 mg by mouth at bedtime. 05/26/23  Yes [provider]    Family History History reviewed. No pertinent family history.  Social History Social History   Tobacco Use   Smoking status: Former    Current  packs/day: 0.50    Average packs/day: 0.5 packs/day for 10.0 years (5.0 ttl pk-yrs)    Types: Cigarettes   Smokeless tobacco: Never  Vaping Use   Vaping status: Every Day  Substance Use Topics   Alcohol use: Yes    Comment: 2 drinks per week   Drug use: No     Allergies   Patient has no known allergies.   Review of Systems Review of Systems  Constitutional:  Negative for fever.  HENT:  Positive for congestion, rhinorrhea and sneezing. Negative for ear pain and sore throat.   Respiratory:  Negative for cough, shortness of breath and wheezing.   Gastrointestinal:  Negative for diarrhea, nausea and vomiting.  Neurological:  Positive for headaches.     Physical Exam Triage Vital Signs ED Triage Vitals  Encounter Vitals Group     BP 06/18/23 0913 122/88     Systolic BP Percentile --      Diastolic BP Percentile --      Pulse Rate 06/18/23 0912 73     Resp 06/18/23 0912 18     Temp 06/18/23 0912 98 F (36.7 C)  Temp Source 06/18/23 0912 Oral     SpO2 06/18/23 0912 99 %     Weight --      Height --      Head Circumference --      Peak Flow --      Pain Score 06/18/23 0911 0     Pain Loc --      Pain Education --      Exclude from Growth Chart --    No data found.  Updated Vital Signs BP 122/88   Pulse 73   Temp 98 F (36.7 C) (Oral)   Resp 18   LMP  (LMP Unknown)   SpO2 99%   Visual Acuity Right Eye Distance:   Left Eye Distance:   Bilateral Distance:    Right Eye Near:   Left Eye Near:    Bilateral Near:     Physical Exam Vitals and nursing note reviewed.  Constitutional:      Appearance: Normal appearance. She is not ill-appearing.  HENT:     Head: Normocephalic and atraumatic.     Right Ear: Tympanic membrane, ear canal and external ear normal. There is no impacted cerumen.     Left Ear: Tympanic membrane, ear canal and external ear normal. There is no impacted cerumen.     Nose: Congestion and rhinorrhea present.     Comments: This mucosa  is erythematous and edematous with yellow discharge in both nares.  Bilateral maxillary and frontal sinuses are nontender to compression.    Mouth/Throat:     Mouth: Mucous membranes are moist.     Pharynx: Oropharynx is clear. No oropharyngeal exudate or posterior oropharyngeal erythema.  Cardiovascular:     Rate and Rhythm: Normal rate and regular rhythm.     Pulses: Normal pulses.     Heart sounds: Normal heart sounds. No murmur heard.    No friction rub. No gallop.  Pulmonary:     Effort: Pulmonary effort is normal.     Breath sounds: Normal breath sounds. No wheezing, rhonchi or rales.  Musculoskeletal:     Cervical back: Normal range of motion and neck supple. No tenderness.  Lymphadenopathy:     Cervical: No cervical adenopathy.  Skin:    General: Skin is warm and dry.     Capillary Refill: Capillary refill takes less than 2 seconds.     Findings: No erythema or rash.  Neurological:     General: No focal deficit present.     Mental Status: She is alert and oriented to person, place, and time.      UC Treatments / Results  Labs (all labs ordered are listed, but only abnormal results are displayed) Labs Reviewed - No data to display  EKG   Radiology No results found.  Procedures Procedures (including critical care time)  Medications Ordered in UC Medications - No data to display  Initial Impression / Assessment and Plan / UC Course  I have reviewed the triage vital signs and the nursing notes.  Pertinent labs & imaging results that were available during my care of the patient were reviewed by me and considered in my medical decision making (see chart for details).   Patient is a pleasant, nontoxic-appearing 33 year old female presenting for evaluation of 3 days with respiratory symptoms as outlined HPI above.  Her physical exam does reveal inflammation of her upper respiratory tract but no sinus tenderness.  Cardiopulmonary exam is benign.  She has been exposed  to influenza A, however, her  symptoms began 3 days ago so she is outside the therapeutic window for antivirals so I will not test her at this time.  We discussed that this could possibly be COVID as well but after discussion and shared decision making COVID testing was deferred.  I do suspect that she has a viral upper respiratory infection and I will discharge her home with a prescription for Atrovent nasal spray to help with her nasal congestion.  Tylenol and ibuprofen as needed for fever or bodyaches.  I have advised her that she should wear a mask around others for an additional 2 days in case this is COVID as the current guidelines recommend masking for the first 5 days of symptoms if she is COVID-positive.  Return precautions reviewed.  Work note provided.   Final Clinical Impressions(s) / UC Diagnoses   Final diagnoses:  Viral upper respiratory tract infection     Discharge Instructions      You have been diagnosed with a viral upper respiratory infection today.  Please use over-the-counter Tylenol and/or ibuprofen according the package instructions as needed for any fever or pain.  Use the Atrovent nasal spray, 2 squirts up each nostril every 6 hours, as needed for runny nose and nasal congestion.  I do recommend that you wear mask around other people for the next 2 days, in case this is COVID, to prevent spreading this illness to others or contracting an additional illness yourself.  If you develop any new or worsening symptoms either return for reevaluation or see your primary care provider.     ED Prescriptions     Medication Sig Dispense Auth. Provider   ipratropium (ATROVENT) 0.06 % nasal spray Place 2 sprays into both nostrils 4 (four) times daily. 15 mL Becky Augusta, NP      PDMP not reviewed this encounter.   Becky Augusta, NP 06/18/23 [phone]

## 2023-06-18 NOTE — Discharge Instructions (Signed)
You have been diagnosed with a viral upper respiratory infection today.  Please use over-the-counter Tylenol and/or ibuprofen according the package instructions as needed for any fever or pain.  Use the Atrovent nasal spray, 2 squirts up each nostril every 6 hours, as needed for runny nose and nasal congestion.  I do recommend that you wear mask around other people for the next 2 days, in case this is COVID, to prevent spreading this illness to others or contracting an additional illness yourself.  If you develop any new or worsening symptoms either return for reevaluation or see your primary care provider.

## 2023-06-18 NOTE — ED Triage Notes (Signed)
Pt c/o sinus pressure, nasal congestion and headache x 3 days.

## 2023-10-15 ENCOUNTER — Ambulatory Visit
Admission: EM | Admit: 2023-10-15 | Discharge: 2023-10-15 | Disposition: A | Discharge status: Home / Self Care | Attending: Family Medicine | Admitting: Family Medicine

## 2023-10-15 ENCOUNTER — Ambulatory Visit (INDEPENDENT_AMBULATORY_CARE_PROVIDER_SITE_OTHER)

## 2023-10-15 ENCOUNTER — Encounter: Payer: Self-pay | Admitting: Emergency Medicine

## 2023-10-15 DIAGNOSIS — W19XXXA Unspecified fall, initial encounter: Secondary | ICD-10-CM

## 2023-10-15 DIAGNOSIS — M25562 Pain in left knee: Secondary | ICD-10-CM

## 2023-10-15 DIAGNOSIS — M79672 Pain in left foot: Secondary | ICD-10-CM | POA: Diagnosis not present

## 2023-10-15 MED ORDER — CYCLOBENZAPRINE HCL 5 MG PO TABS: 5.0000 mg | ORAL_TABLET | Freq: Three times a day (TID) | ORAL | 0 refills | Status: AC | PRN

## 2023-10-15 MED ORDER — KETOROLAC TROMETHAMINE 60 MG/2ML IM SOLN
30.0000 mg | Freq: Once | INTRAMUSCULAR | Status: AC
  Administered 2023-10-15: 30 mg via INTRAMUSCULAR

## 2023-10-15 MED ORDER — NAPROXEN 500 MG PO TABS: 500.0000 mg | ORAL_TABLET | Freq: Two times a day (BID) | ORAL | 0 refills | Status: AC

## 2023-10-15 NOTE — ED Provider Notes (Signed)
 [facility]    CSN: [id number] Arrival date & time: 10/15/23  0806      History   Chief Complaint Chief Complaint  Patient presents with   Fall    HPI  HPI Donna Caldwell is a 33 y.o. female.   Donna Caldwell presents after falling down the stairs thi morning while carrying her son. There was a roll of painters tape and she tried to step around it.  She heard a "crack" while her left knee went backwards. Had immediate left knee and left foot pain.  She felt light headed and felt nauseous but did not hit her head or syncope.  Has has been "hobbing along."  Left knee pain worse with bending and foot with putting pressure on it. She has been applying ice. No known swelling, bruising or erythema.  She did not taken anything for pain.  No previous knee injury but has sprained her ankle while playing sports.      Past Medical History:  Diagnosis Date   Anemia    Anxiety    Asthma    allergies/asthma   Bronchitis    Depression     Patient Active Problem List   Diagnosis Date Noted   Previous cesarean delivery affecting pregnancy 05/16/2020   Gestational diabetes mellitus (GDM) in third trimester 03/24/2020    Past Surgical History:  Procedure Laterality Date   CESAREAN SECTION N/A 03/07/2015   Procedure: CESAREAN SECTION;  Surgeon: Darl Edu, MD;  Location: [facility] ORS;  Service: Obstetrics;  Laterality: N/A;   NO PAST SURGERIES     wisdom teth Bilateral     OB History     Gravida  1   Para  1   Term  1   Preterm      AB      Living  0      SAB      IAB      Ectopic      Multiple  0   Live Births               Home Medications    Prior to Admission medications   Medication Sig Start Date End Date Taking? Authorizing Provider  amphetamine-dextroamphetamine (ADDERALL XR) 5 MG 24 hr capsule Take 5 mg by mouth daily. 10/10/23  Yes [provider]  cyclobenzaprine (FLEXERIL) 5 MG tablet Take 1 tablet (5 mg total) by mouth 3  (three) times daily as needed. 10/15/23  Yes Donna Oliva, DO  naproxen (NAPROSYN) 500 MG tablet Take 1 tablet (500 mg total) by mouth 2 (two) times daily with a meal. 10/15/23  Yes Donna Heggie, DO  sertraline (ZOLOFT) 100 MG tablet Take 150 mg by mouth at bedtime. 05/26/23  Yes [provider]  ipratropium (ATROVENT ) 0.06 % nasal spray Place 2 sprays into both nostrils 4 (four) times daily. 06/18/23   Donna Pear, NP    Family History No family history on file.  Social History Social History   Tobacco Use   Smoking status: Former    Current packs/day: 0.50    Average packs/day: 0.5 packs/day for 10.0 years (5.0 ttl pk-yrs)    Types: Cigarettes   Smokeless tobacco: Never  Vaping Use   Vaping status: Every Day  Substance Use Topics   Alcohol use: Yes    Comment: 2 drinks per week   Drug use: No     Allergies   Patient has no known allergies.   Review of Systems Review of Systems: :  negative unless otherwise stated in HPI.      Physical Exam Triage Vital Signs ED Triage Vitals  Encounter Vitals Group     BP 10/15/23 0828 106/73     Systolic BP Percentile --      Diastolic BP Percentile --      Pulse Rate 10/15/23 0828 76     Resp 10/15/23 0828 18     Temp 10/15/23 0828 98.7 F (37.1 C)     Temp Source 10/15/23 0828 Oral     SpO2 10/15/23 0828 97 %     Weight --      Height --      Head Circumference --      Peak Flow --      Pain Score 10/15/23 0827 9     Pain Loc --      Pain Education --      Exclude from Growth Chart --    No data found.  Updated Vital Signs BP 106/73 (BP Location: Left Arm)   Pulse 76   Temp 98.7 F (37.1 C) (Oral)   Resp 18   LMP 10/05/2023   SpO2 97%   Visual Acuity Right Eye Distance:   Left Eye Distance:   Bilateral Distance:    Right Eye Near:   Left Eye Near:    Bilateral Near:     Physical Exam GEN: well appearing female in no acute distress  CVS: well perfused, distal pulses intact  RESP: speaking  in full sentences without pause, no respiratory distress  MSK:   Left Knee Exam -Inspection: no deformity, no discoloration -Palpation: +medial and lateral joint line tenderness, no tibial tuberosity tenderness, + patella tenderness, no appreciable joint infusion, + patellar tendon TTP -ROM: active Knee range of motion limited due to pain, full passive ROM -Special Tests: Varus Stress: Negative; Valgus Stress: Negative; Anterior: Negative; Posterior drawer: Negative;  Thessaly: not attempted; Patellar grind: positive -Limb neurovascularly intact, no instability noted  Ankle/Foot, left: TTP noted at the 1st-3rd Metatarsals and great toe. No visible erythema, swelling, ecchymosis, or bony deformity.  No evidence of tibiotalar deviation; Range of motion is full in all directions. Strength is 5/5 in all directions. No tenderness at the insertion/body/myotendinous junction of the Achilles tendon; No tenderness on posterior aspects of lateral and medial malleolus; Unremarkable squeeze; Talar dome non-tender; Unremarkable calcaneal squeeze; No plantar calcaneal tenderness; No tenderness over the navicular prominence or  over cuboid; No pain at base of 5th MT; Able to walk 4 steps but with pain     UC Treatments / Results  Labs (all labs ordered are listed, but only abnormal results are displayed) Labs Reviewed - No data to display  EKG   Radiology DG Knee Complete 4 Views Left Result Date: 10/15/2023 CLINICAL DATA:  Fall.  Pain. EXAM: LEFT KNEE - COMPLETE 4+ VIEW COMPARISON:  None Available. FINDINGS: No definite acute fracture. No subluxation or dislocation. No joint effusion. Oblique linear lucency through the superolateral patella is most suggestive of bipartite anatomy given appearance and lack of associated joint effusion and soft tissue edema. Quadriceps tendon and patellar tendon are normal in appearance. IMPRESSION: No acute abnormality. Bipartite patella anatomy. Electronically Signed   By:  Donna Caldwell M.D.   On: 10/15/2023 09:16   DG Foot Complete Left Result Date: 10/15/2023 CLINICAL DATA:  First through third metatarsal pain after fall EXAM: LEFT FOOT - COMPLETE 3+ VIEW COMPARISON:  None Available. FINDINGS: Frontal, oblique, and lateral views of the left  foot are obtained. No acute fracture, subluxation, or dislocation. Prominent inferior calcaneal spur. Mild left ankle osteoarthritis, otherwise joint spaces are well preserved. Soft tissues are unremarkable. IMPRESSION: 1. No acute displaced fracture. 2. Prominent inferior calcaneal spur. 3. Mild osteoarthritis of the ankle. Electronically Signed   By: Donna Caldwell M.D.   On: 10/15/2023 09:14     Procedures Procedures (including critical care time)  Medications Ordered in UC Medications  ketorolac (TORADOL) injection 30 mg (30 mg Intramuscular Given 10/15/23 0911)    Initial Impression / Assessment and Plan / UC Course  I have reviewed the triage vital signs and the nursing notes.  Pertinent labs & imaging results that were available during my care of the patient were reviewed by me and considered in my medical decision making (see chart for details).      Pt is a 33 y.o.  female who presents after a fall down the stairs at home this morning.  Discussed p.o. versus IM pain control and patient however requested IM pain control as she has not eaten breakfast this morning.  Toradol 30 mg IM given.  Has left foot and ankle pain. On exam, pt has tenderness at 1st-3rd Metatarsals and great toe with medial, lateral joint line tenderness and patella and patellar tendon tenderness on left knee concerning for possible fracture.   Obtained left knee and left foot plain films.  Personally interpreted by me were unremarkable for fracture or dislocation in the foot. She does of heel bone spurs visible. On knee plain films she has separation of the patellar concerning for possible fracture however the radiologist report reviewed and  additionally notes oblique linear lucency through the superolateral patella is most suggestive of bipartite anatomy given appearance and lack of associated joint effusion and soft tissue edema. No definite fracture seen.  Patient placed in hinged knee brace.  Crutches declined.  Patient to gradually return to normal activities, as tolerated and continue ordinary activities within the limits permitted by pain. Prescribed Naproxen sodium  and muscle relaxer  for pain relief.  Tylenol  PRN. Advised patient to avoid OTC NSAIDs while taking prescription NSAID.   Patient to follow up with orthopedic provider, if symptoms do not improve with conservative treatment.  Return and ED precautions given. Understanding voiced. Discussed MDM, treatment plan and plan for follow-up with patient who agrees with plan.   Final Clinical Impressions(s) / UC Diagnoses   Final diagnoses:  Fall, initial encounter  Acute pain of left knee  Left foot pain     Discharge Instructions      If medication was prescribed, stop by the pharmacy to pick up your prescriptions.  For your  pain, Take 1500 mg Tylenol  twice a day, take muscle relaxer (Flexeril), Naprosyn twice a day,  as needed for pain. Wear your knee brace. Rest and elevate the affected painful area.  Apply cold compresses intermittently, as needed.  As pain recedes, begin normal activities slowly as tolerated.  Follow up with primary care provider or an orthopedic provider, if symptoms persist.  Watch for worsening symptoms such as an increasing weakness or loss of sensation, increasing pain and/or the loss of bladder or bowel function. Should any of these occur, go to the emergency department immediately.       ED Prescriptions     Medication Sig Dispense Auth. Provider   naproxen (NAPROSYN) 500 MG tablet Take 1 tablet (500 mg total) by mouth 2 (two) times daily with a meal. 30 tablet Donna Caldwell, Donna Amen,  DO   cyclobenzaprine (FLEXERIL) 5 MG tablet Take 1  tablet (5 mg total) by mouth 3 (three) times daily as needed. 30 tablet Donna Fulco, DO      PDMP not reviewed this encounter.   Donna Casasola, DO 10/15/23 1146

## 2023-10-15 NOTE — Discharge Instructions (Addendum)
 If medication was prescribed, stop by the pharmacy to pick up your prescriptions.  For your  pain, Take 1500 mg Tylenol  twice a day, take muscle relaxer (Flexeril), Naprosyn twice a day,  as needed for pain. Wear your knee brace. Rest and elevate the affected painful area.  Apply cold compresses intermittently, as needed.  As pain recedes, begin normal activities slowly as tolerated.  Follow up with primary care provider or an orthopedic provider, if symptoms persist.  Watch for worsening symptoms such as an increasing weakness or loss of sensation, increasing pain and/or the loss of bladder or bowel function. Should any of these occur, go to the emergency department immediately.

## 2023-10-15 NOTE — ED Triage Notes (Signed)
 Pt was carrying her son and slipped on the carpet at home. Her left leg went behind her and she heard a crack. She c/o left knee pain and pain on the top of her left foot.
